# Patient Record
Sex: Male | Born: 1955
Health system: Southern US, Community
[De-identification: ages and names within clinical notes are randomized; demographics above are authoritative.]

## PROBLEM LIST (undated history)

## (undated) DIAGNOSIS — D649 Anemia, unspecified: Secondary | ICD-10-CM

## (undated) DIAGNOSIS — K219 Gastro-esophageal reflux disease without esophagitis: Secondary | ICD-10-CM

## (undated) DIAGNOSIS — I10 Essential (primary) hypertension: Secondary | ICD-10-CM

## (undated) DIAGNOSIS — C801 Malignant (primary) neoplasm, unspecified: Secondary | ICD-10-CM

## (undated) DIAGNOSIS — E785 Hyperlipidemia, unspecified: Secondary | ICD-10-CM

## (undated) DIAGNOSIS — F419 Anxiety disorder, unspecified: Secondary | ICD-10-CM

## (undated) HISTORY — PX: COLONOSCOPY: SHX174

## (undated) HISTORY — DX: Hyperlipidemia, unspecified: E78.5

## (undated) HISTORY — DX: Essential (primary) hypertension: I10

---

## 2009-06-10 HISTORY — PX: CARDIAC CATHETERIZATION: SHX172

## 2009-06-17 ENCOUNTER — Inpatient Hospital Stay (HOSPITAL_COMMUNITY): Admission: EM | Admit: 2009-06-17 | Discharge: 2009-06-19 | Payer: Self-pay | Admitting: Emergency Medicine

## 2009-06-17 ENCOUNTER — Ambulatory Visit: Payer: Self-pay | Admitting: Internal Medicine

## 2009-06-18 ENCOUNTER — Encounter: Payer: Self-pay | Admitting: Cardiovascular Disease

## 2009-07-09 DIAGNOSIS — R079 Chest pain, unspecified: Secondary | ICD-10-CM | POA: Insufficient documentation

## 2009-07-09 DIAGNOSIS — E785 Hyperlipidemia, unspecified: Secondary | ICD-10-CM | POA: Insufficient documentation

## 2009-07-09 DIAGNOSIS — F411 Generalized anxiety disorder: Secondary | ICD-10-CM | POA: Insufficient documentation

## 2009-07-11 ENCOUNTER — Ambulatory Visit: Payer: Self-pay | Admitting: Cardiovascular Disease

## 2010-03-03 ENCOUNTER — Ambulatory Visit: Payer: Self-pay | Admitting: Cardiovascular Disease

## 2010-05-12 NOTE — Assessment & Plan Note (Signed)
Summary: eph/ gd   CC:  follow up hospital.  History of Present Illness: Jason Bray is a patient of Dr Jason Bray and Jason Bray.  He was recently hospitalized for SSCP.  I am not sure how he ended up F/U with me.  I had to review his HP and cath.  He apparantly came in with marked anterolateral ST segment depression and R/O for MI.  His cath showed no fixed epicardial CAD.  I reviewed this and saw no myocardial bridging either.  There was a question of spasm but he was D/C on BB.  Since D/C he has had numerous minor episodes of what sound like musculoskeletal pain.  He is very anxious and fixates on his heart.  He does not describe exertional pain.  He has taken nitro on two occasions and is unsure if this has helped.  He has not had any pain lasting more than a minute or two.  I suggested that he see Dr Jason Bray about starting an anxiolytic or anti-depressant.  He denies palpitations, dyspnea, syncope or edema He has been compliant with his meds.    Review E-chart records Review Cath films  Current Problems (verified): 1)  Chest Pain  (ICD-786.50) 2)  Anxiety  (ICD-300.00) 3)  Hyperlipidemia  (ICD-272.4)  Current Medications (verified): 1)  Aspirin Ec 325 Mg Tbec (Aspirin) .... Take One Tablet By Mouth Daily 2)  Simvastatin 20 Mg Tabs (Simvastatin) .... Take One Tablet By Mouth Daily At Bedtime 3)  Metoprolol Tartrate 25 Mg Tabs (Metoprolol Tartrate) .... Take One Tablet By Mouth Twice A Day 4)  Nitrostat 0.4 Mg Subl (Nitroglycerin) .Marland Kitchen.. 1 Tablet Under Tongue At Onset of Chest Pain; You May Repeat Every 5 Minutes For Up To 3 Doses.  Allergies (verified): No Known Drug Allergies  Past History:  Family History: Last updated: 07/11/2009 non-contributory  Family History: non-contributory  Social History: Married Non-smoker [employer] Works for [name]  Review of Systems       Denies fever, malais, weight loss, blurry vision, decreased visual acuity, cough, sputum, SOB,  hemoptysis, pleuritic pain, palpitaitons, heartburn, abdominal pain, melena, lower extremity edema, claudication, or rash. All other systems reviewed and negative  Vital Signs:  Patient profile:   55 year old male Height:      67 inches Weight:      166 pounds BMI:     26.09 Pulse rate:   80 / minute Resp:     12 per minute BP sitting:   130 / 80  (left arm)  Vitals Entered By: Jason Bray (July 11, 2009 8:20 AM)  Physical Exam  General:  Affect appropriate Healthy:  appears stated age HEENT: normal Neck supple with no adenopathy JVP normal no bruits no thyromegaly Lungs clear with no wheezing and good diaphragmatic motion Heart:  S1/S2 no murmur,rub, gallop or click PMI normal Abdomen: benighn, BS positve, no tenderness, no AAA no bruit.  No HSM or HJR Distal pulses intact with no bruits No edema Neuro non-focal Skin warm and dry    Impression & Recommendations:  Problem # 1:  CHEST PAIN (ICD-786.50) No fixed epicardial CAD.  ? spasm though unlikely as he presented with ST depression not elevation.  Continue ASA and BB especially since anxiety brings this on.  Consider F/U cardiac CT in 6 months.  as needed nitro His updated medication list for this problem includes:    Aspirin Ec 325 Mg Tbec (Aspirin) .Marland Kitchen... Take one tablet by mouth daily  Metoprolol Tartrate 25 Mg Tabs (Metoprolol tartrate) .Marland Kitchen... Take one tablet by mouth twice a day    Nitrostat 0.4 Mg Subl (Nitroglycerin) .Marland Kitchen... 1 tablet under tongue at onset of chest pain; you may repeat every 5 minutes for up to 3 doses.  Problem # 2:  ANXIETY (ICD-300.00) F/U Dr Jason Bray would benefit form counseling or med like Cymbalta  Problem # 3:  HYPERLIPIDEMIA (ICD-272.4) F/U lipid porifile in 6 months His updated medication list for this problem includes:    Simvastatin 20 Mg Tabs (Simvastatin) .Marland Kitchen... Take one tablet by mouth daily at bedtime  Patient Instructions: 1)  Your physician recommends that you schedule a  follow-up appointment in: 6 MONTH

## 2010-05-12 NOTE — Assessment & Plan Note (Signed)
Summary: rov   CC:  check up...pt states he on a med for gerd but does not know the name of it as well as his sleeping pill.  History of Present Illness: Jason Bray is a patient of Dr Ladona Ridgel and Riley Kill.  He was  hospitalized in March  for SSCP.  I am not sure how he ended up F/U with me.  I had to review his HP and cath.  He apparantly came in with marked anterolateral ST segment depression and R/O for MI.  His cath showed no fixed epicardial CAD.  I reviewed this and saw no myocardial bridging either.  There was a question of spasm  He had some anxiety and depression over job issues and this seems to have improved. He has not had furthr SSCP.  He saw Dr Foy Guadalajara recently and his cholesterol is markedly imporved on statin.    Current Problems (verified): 1)  Chest Pain  (ICD-786.50) 2)  Anxiety  (ICD-300.00) 3)  Hyperlipidemia  (ICD-272.4)  Current Medications (verified): 1)  Aspirin Ec 325 Mg Tbec (Aspirin) .... Take One Tablet By Mouth Daily 2)  Simvastatin 20 Mg Tabs (Simvastatin) .... Take One Tablet By Mouth Daily At Bedtime 3)  Metoprolol Tartrate 25 Mg Tabs (Metoprolol Tartrate) .... Take One Tablet By Mouth Twice A Day 4)  Nitrostat 0.4 Mg Subl (Nitroglycerin) .Marland Kitchen.. 1 Tablet Under Tongue At Onset of Chest Pain; You May Repeat Every 5 Minutes For Up To 3 Doses. 5)  Sleeping Pill .... As Needed  Allergies (verified): No Known Drug Allergies  Past History:  Past Medical History: Last updated: 07/09/2009 Current Problems:  CHEST PAIN (ICD-786.50) ANXIETY (ICD-300.00) HYPERLIPIDEMIA (ICD-272.4)  Family History: Last updated: 07/11/2009 non-contributory  Social History: Last updated: 07/11/2009 Married Non-smoker [employer] Works for [name]  Review of Systems       Denies fever, malais, weight loss, blurry vision, decreased visual acuity, cough, sputum, SOB, hemoptysis, pleuritic pain, palpitaitons, heartburn, abdominal pain, melena, lower extremity edema,  claudication, or rash.   Vital Signs:  Patient profile:   55 year old male Height:      67 inches Weight:      166 pounds BMI:     26.09 Pulse rate:   84 / minute Resp:     14 per minute BP sitting:   127 / 78  (left arm)  Vitals Entered By: Kem Parkinson (March 03, 2010 4:25 PM)  Physical Exam  General:  Affect appropriate Healthy:  appears stated age HEENT: normal Neck supple with no adenopathy JVP normal no bruits no thyromegaly Lungs clear with no wheezing and good diaphragmatic motion Heart:  S1/S2 no murmur,rub, gallop or click PMI normal Abdomen: benighn, BS positve, no tenderness, no AAA no bruit.  No HSM or HJR Distal pulses intact with no bruits No edema Neuro non-focal Skin warm and dry    Impression & Recommendations:  Problem # 1:  CHEST PAIN (ICD-786.50) Normal cath  Likely more related to reflux or muscular.   His updated medication list for this problem includes:    Aspirin Ec 325 Mg Tbec (Aspirin) .Marland Kitchen... Take one tablet by mouth daily    Metoprolol Tartrate 25 Mg Tabs (Metoprolol tartrate) .Marland Kitchen... Take one tablet by mouth twice a day    Nitrostat 0.4 Mg Subl (Nitroglycerin) .Marland Kitchen... 1 tablet under tongue at onset of chest pain; you may repeat every 5 minutes for up to 3 doses.  Problem # 2:  ANXIETY (ICD-300.00) Improved and appears  situational related to work.  F/U Dr Foy Guadalajara and consider Cymbalta as needed  Problem # 3:  HYPERLIPIDEMIA (ICD-272.4) Continue statin  Labs per primary His updated medication list for this problem includes:    Simvastatin 20 Mg Tabs (Simvastatin) .Marland Kitchen... Take one tablet by mouth daily at bedtime   EKG Report  Procedure date:  06/18/2009  Findings:      NSR 76 RAD [facility] ST/T wave changes

## 2010-07-06 LAB — BASIC METABOLIC PANEL
BUN: 9 mg/dL (ref 6–23)
Creatinine, Ser: 0.89 mg/dL (ref 0.4–1.5)
GFR calc Af Amer: >60 mL/min (ref >60)

## 2010-07-06 LAB — DIFFERENTIAL
Basophils Relative: 1 % (ref 0–1)
Eosinophils Absolute: 0 10*3/uL (ref 0.0–0.7)
Eosinophils Relative: 0 % (ref 0–5)
Lymphocytes Relative: 19 % (ref 12–46)
Lymphs Abs: 1.9 10*3/uL (ref 0.7–4.0)
Monocytes Absolute: 0.6 10*3/uL (ref 0.1–1.0)
Monocytes Relative: 6 % (ref 3–12)
Neutro Abs: 7.5 10*3/uL (ref 1.7–7.7)

## 2010-07-06 LAB — CK TOTAL AND CKMB (NOT AT ARMC)
CK, MB: 1.3 ng/mL (ref 0.3–4.0)
Relative Index: 0.9 (ref 0.0–2.5)

## 2010-07-06 LAB — CBC
HCT: 43 % (ref 39.0–52.0)
Hemoglobin: 14.5 g/dL (ref 13.0–17.0)
MCHC: 33.7 g/dL (ref 30.0–36.0)
MCHC: 33.9 g/dL (ref 30.0–36.0)
MCV: 89 fL (ref 78.0–100.0)
Platelets: 417 10*3/uL — ABNORMAL HIGH (ref 150–400)
RBC: 4.83 MIL/uL (ref 4.22–5.81)
RDW: 13 % (ref 11.5–15.5)
WBC: 10.1 10*3/uL (ref 4.0–10.5)

## 2010-07-06 LAB — COMPREHENSIVE METABOLIC PANEL
ALT: 23 U/L (ref 0–53)
AST: 24 U/L (ref 0–37)
Alkaline Phosphatase: 63 U/L (ref 39–117)
BUN: 7 mg/dL (ref 6–23)
CO2: 27 mEq/L (ref 19–32)
GFR calc Af Amer: >60 mL/min (ref >60)
Total Bilirubin: 1.1 mg/dL (ref 0.3–1.2)
Total Protein: 7.7 g/dL (ref 6.0–8.3)

## 2010-07-06 LAB — CARDIAC PANEL(CRET KIN+CKTOT+MB+TROPI)
CK, MB: 1 ng/mL (ref 0.3–4.0)
Relative Index: 0.7 (ref 0.0–2.5)
Total CK: 136 U/L (ref 7–232)
Total CK: 149 U/L (ref 7–232)
Troponin I: 0.06 ng/mL (ref 0.00–0.06)

## 2010-07-06 LAB — POCT CARDIAC MARKERS: Myoglobin, poc: 46.7 ng/mL (ref 12–200)

## 2010-07-06 LAB — HEPARIN LEVEL (UNFRACTIONATED): Heparin Unfractionated: 0.34 IU/mL (ref 0.30–0.70)

## 2010-07-06 LAB — PROTIME-INR: Prothrombin Time: 12.9 seconds (ref 11.6–15.2)

## 2010-07-06 LAB — D-DIMER, QUANTITATIVE: D-Dimer, Quant: <0.22 ug/mL-FEU (ref 0.00–0.48)

## 2010-07-06 LAB — TROPONIN I: Troponin I: 0.01 ng/mL (ref 0.00–0.06)

## 2011-02-10 ENCOUNTER — Other Ambulatory Visit: Payer: Self-pay | Admitting: *Deleted

## 2011-02-10 MED ORDER — SIMVASTATIN 20 MG PO TABS: 20.0000 mg | ORAL_TABLET | Freq: Every day | ORAL | Status: DC

## 2011-02-10 MED ORDER — METOPROLOL TARTRATE 25 MG PO TABS: 25.0000 mg | ORAL_TABLET | Freq: Two times a day (BID) | ORAL | Status: DC

## 2012-08-09 ENCOUNTER — Encounter (INDEPENDENT_AMBULATORY_CARE_PROVIDER_SITE_OTHER): Payer: Self-pay | Admitting: Surgery

## 2012-08-09 ENCOUNTER — Ambulatory Visit (INDEPENDENT_AMBULATORY_CARE_PROVIDER_SITE_OTHER): Payer: BC Managed Care – PPO | Admitting: Surgery

## 2012-08-09 VITALS — BP 138/82 | HR 86 | Temp 98.4°F | Resp 18 | Ht 67.0 in | Wt 174.4 lb

## 2012-08-09 DIAGNOSIS — C184 Malignant neoplasm of transverse colon: Secondary | ICD-10-CM

## 2012-08-09 DIAGNOSIS — D126 Benign neoplasm of colon, unspecified: Secondary | ICD-10-CM | POA: Insufficient documentation

## 2012-08-09 MED ORDER — NEOMYCIN SULFATE 500 MG PO TABS: 1000.0000 mg | ORAL_TABLET | ORAL | Status: DC

## 2012-08-09 MED ORDER — METRONIDAZOLE 500 MG PO TABS: 500.0000 mg | ORAL_TABLET | ORAL | Status: DC

## 2012-08-09 NOTE — Progress Notes (Signed)
Subjective:     Patient ID: Jason Bray, male   DOB: 29-Apr-1955, 57 y.o.   MRN: [id number]  HPI  Jason Bray  28-Jun-1955 [id number]  Patient Care Team: Lenora Boys, MD as PCP - General (Family Medicine) Shirley Friar, MD as Consulting Physician (Gastroenterology) Ardeth Sportsman, MD as Consulting Physician (General Surgery)  This patient is a 57 y.o.male who presents today for surgical evaluation at the request of Dr. Bosie Clos.   Reason for visit: Newly diagnosed mid colon masses.  One strongly suspicious for cancer.  Pleasant active male.  No family history of bowel issues aside from probable colon arteriovenous malformations controlled with cautery on his father.  Had screening colonoscopy.  Found to have large colon polyp in upper descending colon.  Found to have larger circumferential mass in proximal transverse colon very suspicious for cancer.  Biopsies pending.  Surgical consultation requested.  Patient had an episode of chest pain and discomfort.  Negative cardiac workup by Dr. Winn Jock 2011.  Walks on a treadmill 2-1/2 miles a day without difficulty.  No exertional chest/neck/shoulder/arm pain. Daily bowel movements.  No bleeding.  He comes today with his wife.  No personal nor family history of GI/colon cancer, inflammatory bowel disease, irritable bowel syndrome, allergy such as Celiac Sprue, dietary/dairy problems, colitis, ulcers nor gastritis.  No recent sick contacts/gastroenteritis.  No travel outside the country.  No changes in diet.  No abdominal surgeries.    Patient Active Problem List   Diagnosis Date Noted  . 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 Adenomatous colon polyp - prox descending colon 08/09/2012  . HYPERLIPIDEMIA 07/09/2009  . ANXIETY 07/09/2009  . CHEST PAIN 07/09/2009    Past Medical History  Diagnosis Date  . Hyperlipidemia     Past Surgical History  Procedure Laterality Date  . Cardiac catheterization  06/2009    non  cardiac    History   Social History  . Marital Status: Married    Spouse Name: N/A    Number of Children: N/A  . Years of Education: N/A   Occupational History  . Not on file.   Social History Main Topics  . Smoking status: Former Smoker    Types: Cigarettes    Quit date: 01/19/2007  . Smokeless tobacco: Former Neurosurgeon    Quit date: 08/10/2006  . Alcohol Use: 2.4 oz/week    4 Glasses of wine per week     Comment: Weekly.  . Drug Use: No  . Sexually Active: Not on file   Other Topics Concern  . Not on file   Social History Narrative  . No narrative on file    Family History  Problem Relation Age of Onset  . Lymphoma Mother   . Hypertension Father   . Hyperlipidemia Father   . Hypertension Brother     Current Outpatient Prescriptions  Medication Sig Dispense Refill  . aspirin 325 MG tablet Take 325 mg by mouth daily.      . fish oil-omega-3 fatty acids 1000 MG capsule Take 2 g by mouth 3 (three) times daily.      . metoprolol tartrate (LOPRESSOR) 25 MG tablet Take 1 tablet (25 mg total) by mouth 2 (two) times daily.  60 tablet  12  . Misc Natural Products (OSTEO BI-FLEX ADV JOINT SHIELD) TABS Take by mouth.      . simvastatin (ZOCOR) 20 MG tablet Take 1 tablet (20 mg total) by mouth at bedtime.  30 tablet  12  . metroNIDAZOLE (FLAGYL) 500 MG tablet Take 1 tablet (500 mg total) by mouth as directed. Take 2 pills (=1000mg ) by mouth at 1pm, 3pm, and 10pm the day before your colorectal operation as discussed in [facility] office.  Call [phone] with questions  6 tablet  0  . neomycin (MYCIFRADIN) 500 MG tablet Take 2 tablets (1,000 mg total) by mouth as directed. Take 2 pills (=1000mg ) by mouth at 1pm, 3pm, and 10pm the day before your colorectal operation as discussed in [facility] office.  Call [phone] with questions  6 tablet  0  . ranitidine (ZANTAC) 300 MG capsule Take 300 mg by mouth every evening.       No current facility-administered medications for this visit.      No Known Allergies  BP 138/82  Pulse 86  Temp(Src) 98.4 F (36.9 C) (Temporal)  Resp 18  Ht 5\' 7"  (1.702 m)  Wt 174 lb 6.4 oz (79.107 kg)  BMI 27.31 kg/m2  No results found.   Review of Systems  Constitutional: Negative for fever, chills and diaphoresis.  HENT: Negative for nosebleeds, sore throat, facial swelling, mouth sores, trouble swallowing and ear discharge.   Eyes: Negative for photophobia, discharge and visual disturbance.  Respiratory: Negative for choking, chest tightness, shortness of breath and stridor.   Cardiovascular: Negative for chest pain and palpitations.  Gastrointestinal: Negative for nausea, vomiting, abdominal pain, diarrhea, constipation, blood in stool, abdominal distention, anal bleeding and rectal pain.  Genitourinary: Negative for dysuria, urgency, difficulty urinating and testicular pain.  Musculoskeletal: Negative for myalgias, back pain, arthralgias and gait problem.  Skin: Negative for color change, pallor, rash and wound.  Neurological: Negative for dizziness, speech difficulty, weakness, numbness and headaches.  Hematological: Negative for adenopathy. Does not bruise/bleed easily.  Psychiatric/Behavioral: Negative for hallucinations, confusion and agitation.       Objective:   Physical Exam  Constitutional: He is oriented to person, place, and time. He appears well-developed and well-nourished. No distress.  HENT:  Head: Normocephalic.  Mouth/Throat: Oropharynx is clear and moist. No oropharyngeal exudate.  Eyes: Conjunctivae, EOM and lids are normal. Pupils are equal, round, and reactive to light. No scleral icterus.  Neck: Normal range of motion. Neck supple. No tracheal deviation present.  Cardiovascular: Normal rate, regular rhythm and intact distal pulses.   Pulmonary/Chest: Effort normal and breath sounds normal. No respiratory distress.  Abdominal: Soft. He exhibits no distension. There is no tenderness. Hernia confirmed negative  in the right inguinal area and confirmed negative in the left inguinal area.  Musculoskeletal: Normal range of motion. He exhibits no tenderness.  Lymphadenopathy:    He has no cervical adenopathy.       Right: No inguinal adenopathy present.       Left: No inguinal adenopathy present.  Neurological: He is alert and oriented to person, place, and time. No cranial nerve deficit. He exhibits normal muscle tone. Coordination normal.  Skin: Skin is warm and dry. No rash noted. He is not diaphoretic. No erythema. No pallor.  Psychiatric: He has a normal mood and affect. His behavior is normal. Judgment and thought content normal.       Assessment:     Two masses in the mid colon.  Transverse colon strongly suspicious for cancer.  Proximal descending colon consistent with large adenomatous polyp.  Pathology pending.  Need for resection.     Plan:     I recommended the laparoscopic possible open resection.  He may need a  moderate amount of his colon removed.  We will get pathology results.  If cancer confirmed, order CEA & preoperative CT of chest/abdomen/pelvis with oral and IV contrast per protocol.  I discussed the procedure at length with the patient and his wife:  The anatomy & physiology of the digestive tract was discussed.  The pathophysiology was discussed.  Natural history risks without surgery was discussed.   I feel the risks of no intervention will lead to serious problems that outweigh the operative risks; therefore, I recommended a partial colectomy to remove the pathology.  Laparoscopic & open techniques were discussed.   Risks such as bleeding, infection, abscess, leak, reoperation, possible ostomy, hernia, heart attack, death, and other risks were discussed.  I noted a good likelihood this will help address the problem.   Goals of post-operative recovery were discussed as well.  We will work to minimize complications.  An educational handout on the pathology was given as well.   Questions were answered.  The patient & wife expressed understanding & wishes to proceed with surgery ASAP.

## 2012-08-09 NOTE — Patient Instructions (Addendum)
See the Handout(s) we gave you.  Consider surgery.  Please call our office at [phone] if you wish to schedule surgery or if you have further questions / concerns.    Colorectal Cancer Colorectal cancer is an abnormal growth of tissue (tumor) in the colon or rectum that is cancerous (malignant). Unlike noncancerous (benign) tumors, malignant tumors can spread to other parts of your body. The colon is the large bowel or large intestine. The rectum is the last several inches of the colon. CAUSES  The exact cause of colon cancer is unknown.  RISK FACTORS The majority of patients do not have identifiable risk factors, but the following factors may increase your chances of getting colon cancer:  Age. Most colorectal cancers occur in people older than 50 years.  Having abnormal growths (polyps) on the inner wall of the colon or rectum.  Diabetes.  Being African American.  Family history of hereditary nonpolyposis colon cancer. This condition is caused by changes in the genes that are responsible for repairing mismatched DNA.  Family history of familial adenomatous polyposis (FAP). This is a rare, inherited condition in which hundreds of polyps form in the colon and rectum. It is caused by a change in the APC gene. Unless FAP is treated, it usually leads to colorectal cancer by age 68.  Personal history of cancer. A person who has already had colorectal cancer may develop it a second time. Also, women with a history of ovarian, uterine, or breast cancer are at a somewhat higher risk of developing colorectal cancer.  Inflammatory bowel disease, including ulcerative colitis and Crohn's disease.  Being obese or eating a diet that is high in fat (especially animal fat) and low in fiber, fruits, and vegetables.  Smoking. A person who smokes cigarettes may be at increased risk of developing polyps and colorectal cancer.  Heavy alcohol use. SYMPTOMS Early colorectal cancer often does not  cause symptoms. As the cancer grows, symptoms may include:  Diarrhea.  Constipation.  Feeling like the bowel does not empty completely after a bowel movement.  Blood in the stool.  Stools that are narrower than usual.  Abdominal discomfort, pain, bloating, fullness, or cramps.  Unexplained weight loss.  Constant tiredness.  Nausea and vomiting. DIAGNOSIS  Your caregiver will ask about your medical history. He or she may also perform a number of procedures, such as:  A physical exam.  Blood tests. This may include a routine complete blood count and iron level testing. Your caregiver may also check for carcinoembryonic antigen (CEA) and other substances in the blood. Some people who have colorectal cancer have a high CEA level. These levels may be used to follow the activity of your colon cancer.  Chest X-rays, computed tomography (CT) scans, or magnetic resonance imaging (MRI).  Taking a tissue sample (biopsy) from the colon or rectum. The sample is examined under a microscope to look for cancer cells.  Sigmoidoscopy. With this test, your caregiver can see inside your colon. A thin flexible tube (sigmoidoscope) is placed into your rectum. This device has a light source and a tiny video camera in it. Your caregiver uses the sigmoidoscope to look at the last third of your colon.  Colonoscopy. This test is like sigmoidoscopy, but your caregiver looks at the entire colon. This test usually requires medicine that helps you relax (sedative).  Endorectal ultrasound. With this test, your caregiver can see how deep a rectal tumor has grown and whether the cancer has spread to lymph nodes  or other nearby tissues. A tool (probe) is inserted into the rectum. The probe sends out sound waves to the rectum and nearby tissues, and a computer uses the echoes to create a picture. Your cancer will be staged to determine its severity and extent. Staging is a careful attempt to find out the size of the  tumor, whether the cancer has spread, and if so, to what parts of the body. You may need to have more tests to determine the stage of your cancer. The test results will help determine what treatment plan is best for you. STAGES  Stage 0. The cancer is found only in the innermost lining of the colon or rectum.  Stage I. The cancer has grown into the inner wall of the colon or rectum. The cancer has not yet reached the outer wall of the colon.  Stage II. The cancer extends more deeply into or through the wall of the colon or rectum. It may have invaded nearby tissue, but cancer cells have not spread to the lymph nodes.  Stage III. The cancer has spread to nearby lymph nodes but not to other parts of the body.  Stage IV. The cancer has spread to other parts of the body, such as the liver or lungs. Your caregiver may tell you the detailed stage of your cancer. In that case, the stage will include both a number and a letter. TREATMENT  Depending on the type and stage, colorectal cancer may be treated with surgery, radiation therapy, or chemotherapy. Some patients have a combination of these therapies.  Surgery may be done to remove the polyps from your colon. In early stages, your caregiver may be able to do this during a colonoscopy. In later stages, surgery may be done to remove part of your colon.  Radiation therapy uses high-energy rays to kill cancer cells. This is usually recommended for patients with rectal cancer.  Chemotherapy is the use of drugs to kill cancer cells. Caregivers also give chemotherapy to help reduce pain and other problems caused by colorectal cancer. This may be done even if the cancer is not curable. HOME CARE INSTRUCTIONS   Only take over-the-counter or prescription medicines for pain, discomfort, or fever as directed by your caregiver.  Maintain a healthy diet.  Consider joining a support group. This may help you learn to cope with the stress of having colorectal  cancer.  Seek advice to help you manage treatment side effects.  Keep all follow-up appointments as directed by your caregiver.  Inform your cancer specialist if you are admitted to the hospital. SEEK IMMEDIATE MEDICAL CARE IF:   Your diarrhea or constipation does not go away.  You have alternating constipation and diarrhea.  You have blood in your stools.  Your abdominal pain gets worse.  You lose weight without trying.  You notice new fatigue or weakness.  You develop a fever during chemotherapy treatment. Document Released: 03/29/2005 Document Revised: 06/21/2011 Document Reviewed: 03/16/2011 [facility] Patient Information 2013 [place], [place].  ABDOMINAL SURGERY: POST OP INSTRUCTIONS  1. DIET: Follow a light bland diet the first 24 hours after arrival home, such as soup, liquids, crackers, etc.  Be sure to include lots of fluids daily.  Avoid fast food or heavy meals as your are more likely to get nauseated.  Eat a low fat the next few days after surgery.   2. Take your usually prescribed home medications unless otherwise directed. 3. PAIN CONTROL: a. Pain is best controlled by a usual combination  of three different methods TOGETHER: i. Ice/Heat ii. Over the counter pain medication iii. Prescription pain medication b. Most patients will experience some swelling and bruising around the incisions.  Ice packs or heating pads (30-60 minutes up to 6 times a day) will help. Use ice for the first few days to help decrease swelling and bruising, then switch to heat to help relax tight/sore spots and speed recovery.  Some people prefer to use ice alone, heat alone, alternating between ice & heat.  Experiment to what works for you.  Swelling and bruising can take several weeks to resolve.   c. It is helpful to take an over-the-counter pain medication regularly for the first few weeks.  Choose one of the following that works best for you: i. Naproxen (Aleve, etc)  Two 220mg  tabs twice a  day ii. Ibuprofen (Advil, etc) Three 200mg  tabs four times a day (every meal & bedtime) iii. Acetaminophen (Tylenol, etc) 500-650mg  four times a day (every meal & bedtime) d. A  prescription for pain medication (such as oxycodone, hydrocodone, etc) should be given to you upon discharge.  Take your pain medication as prescribed.  i. If you are having problems/concerns with the prescription medicine (does not control pain, nausea, vomiting, rash, itching, etc), please call us [phone] to see if we need to switch you to a different pain medicine that will work better for you and/or control your side effect better. ii. If you need a refill on your pain medication, please contact your pharmacy.  They will contact our office to request authorization. Prescriptions will not be filled after 5 pm or on week-ends. 4. Avoid getting constipated.  Between the surgery and the pain medications, it is common to experience some constipation.  Increasing fluid intake and taking a fiber supplement (such as Metamucil, Citrucel, FiberCon, MiraLax, etc) 1-2 times a day regularly will usually help prevent this problem from occurring.  A mild laxative (prune juice, Milk of Magnesia, MiraLax, etc) should be taken according to package directions if there are no bowel movements after 48 hours.   5. Watch out for diarrhea.  If you have many loose bowel movements, simplify your diet to bland foods & liquids for a few days.  Stop any stool softeners and decrease your fiber supplement.  Switching to mild anti-diarrheal medications (Kayopectate, Pepto Bismol) can help.  If this worsens or does not improve, please call us. 6. Wash / shower every day.  You may shower over the incision / wound.  Avoid baths until the skin is fully healed.  Continue to shower over incision(s) after the dressing is off. 7. Remove your waterproof bandages 5 days after surgery.  You may leave the incision open to air.  You may replace a dressing/Band-Aid  to cover the incision for comfort if you wish. 8. ACTIVITIES as tolerated:   a. You may resume regular (light) daily activities beginning the next day-such as daily self-care, walking, climbing stairs-gradually increasing activities as tolerated.  If you can walk 30 minutes without difficulty, it is safe to try more intense activity such as jogging, treadmill, bicycling, low-impact aerobics, swimming, etc. b. Save the most intensive and strenuous activity for last such as sit-ups, heavy lifting, contact sports, etc  Refrain from any heavy lifting or straining until you are off narcotics for pain control.   c. DO NOT PUSH THROUGH PAIN.  Let pain be your guide: If it hurts to do something, don't do it.  Pain is your body  warning you to avoid that activity for another week until the pain goes down. d. You may drive when you are no longer taking prescription pain medication, you can comfortably wear a seatbelt, and you can safely maneuver your car and apply brakes. e. Bonita Quin may have sexual intercourse when it is comfortable.  9. FOLLOW UP in our office a. Please call [facility] at [phone] to set up an appointment to see your surgeon in the office for a follow-up appointment approximately 1-2 weeks after your surgery. b. Make sure that you call for this appointment the day you arrive home to insure a convenient appointment time. 10. IF YOU HAVE DISABILITY OR FAMILY LEAVE FORMS, BRING THEM TO THE OFFICE FOR PROCESSING.  DO NOT GIVE THEM TO YOUR DOCTOR.   WHEN TO CALL us [phone]: 1. Poor pain control 2. Reactions / problems with new medications (rash/itching, nausea, etc)  3. Fever over 101.5 F (38.5 C) 4. Inability to urinate 5. Nausea and/or vomiting 6. Worsening swelling or bruising 7. Continued bleeding from incision. 8. Increased pain, redness, or drainage from the incision  The clinic staff is available to answer your questions during regular business hours (8:30am-5pm).  Please don't  hesitate to call and ask to speak to one of our nurses for clinical concerns.   A surgeon from [facility] is always on call at the hospitals   If you have a medical emergency, go to the nearest emergency room or call 911.    [facility] [address], [place], [place]  [address] ? MAIN: [phone] ? TOLL FREE: [phone] ? FAX [phone] [email or web address]   COLON PREP INSTRUCTIONS for upper/proximal colectomy:   Obtain what you need at a pharmacy of your choice:      Prescriptions for your oral antibiotics (Neomycin & Metronidazole)     A bottle of Milk of Magnesia   DAY PRIOR TO SURGERY:    1:00pm    o Take 2 oz (4 tablespoons) Milk of Magnesia. o Drink plenty of liquids o Take 2 Neomycin 500mg  tablets & 2 Metronidazole 500mg  tablets     3:00pm:    o Take 2 Neomycin 500mg  tablets & 2 Metronidazole 500mg  tablets  o Drink plenty of liquids    Bedtime (~10:00pm)  o Take 2 Neomycin 500mg  tablets & 2 Metronidazole 500mg  tablets o Drink plenty of liquids    Midnight:  Do not eat or drink anything after midnight the night before your surgery.   MORNING OF PROCEDURE:    Remember to not to drink or eat anything that morning      If you have questions or problems, please call [facility] [phone] speak to someone in the clinic department at our office

## 2012-08-11 ENCOUNTER — Telehealth (INDEPENDENT_AMBULATORY_CARE_PROVIDER_SITE_OTHER): Payer: Self-pay

## 2012-08-11 ENCOUNTER — Encounter (HOSPITAL_COMMUNITY): Payer: Self-pay | Admitting: Pharmacy Technician

## 2012-08-11 DIAGNOSIS — C189 Malignant neoplasm of colon, unspecified: Secondary | ICD-10-CM

## 2012-08-11 NOTE — Telephone Encounter (Signed)
Called pt's wife to notify her that we did receive the path report on the polyps. The larger polyp is positive for colon cancer and the smaller polyp is not a cancer but a tubulovillous adenoma per Dr Michaell Cowing. I advised her that this does not change the surgery plan that Dr Michaell Cowing discussed with them this week. I did advise her that since one of the polyp is positive for colon cancer we need to get a CEA and CT chest,abdomen,pelvis with contrast b/c this is standard care with a new diagnosis of colon cancer. I have our scheduling cordinator working on the date and time of the CT. We will call her back with the CT appt for next week.

## 2012-08-15 ENCOUNTER — Ambulatory Visit
Admission: RE | Admit: 2012-08-15 | Discharge: 2012-08-15 | Disposition: A | Discharge status: Home / Self Care | Payer: BC Managed Care – PPO | Source: Ambulatory Visit | Attending: Surgery | Admitting: Surgery

## 2012-08-15 DIAGNOSIS — C189 Malignant neoplasm of colon, unspecified: Secondary | ICD-10-CM

## 2012-08-15 LAB — BASIC METABOLIC PANEL
CO2: 27 mEq/L (ref 19–32)
Calcium: 9.8 mg/dL (ref 8.4–10.5)
Glucose, Bld: 81 mg/dL (ref 70–99)
Potassium: 4.2 mEq/L (ref 3.5–5.3)
Sodium: 138 mEq/L (ref 135–145)

## 2012-08-15 MED ORDER — IOHEXOL 300 MG/ML  SOLN
100.0000 mL | Freq: Once | INTRAMUSCULAR | Status: AC | PRN
  Administered 2012-08-15: 100 mL via INTRAVENOUS

## 2012-08-16 ENCOUNTER — Encounter (INDEPENDENT_AMBULATORY_CARE_PROVIDER_SITE_OTHER): Payer: Self-pay

## 2012-08-16 ENCOUNTER — Telehealth (INDEPENDENT_AMBULATORY_CARE_PROVIDER_SITE_OTHER): Payer: Self-pay

## 2012-08-16 NOTE — Telephone Encounter (Signed)
Called pt to give him his results to the CT scan and lab work. Pt advised of the CT scans showing no obvious metastatic disease but there is one lymph node that is enlarged that was near the polyp that was removed. I advised pt that we will not know anymore about the lymph node until after the surgery when the node is removed and sent to pathology. The pt advised about his labs being normal per Dr Michaell Cowing. The CEA lab was normal which Dr Michaell Cowing said this is a very good sign that we caught the cancer early. The pt requested that I leave this same message on his house answering machine so his wife could hear when they get home. I did call the house and give the same info. On the machine per pt's request. I advised if pt has any questions to please call the office.

## 2012-08-16 NOTE — Telephone Encounter (Signed)
Wife asking should Patient ask for extra time off from work. He applied for short term disability . Should he ask for FMLA ? Advised when patient comes in for Post op with Dr. Michaell Cowing it could be determined at that time . Patient is scheduled for surgery 08/25/12

## 2012-08-18 ENCOUNTER — Other Ambulatory Visit (HOSPITAL_COMMUNITY): Payer: Self-pay | Admitting: *Deleted

## 2012-08-21 ENCOUNTER — Encounter (HOSPITAL_COMMUNITY)
Admission: RE | Admit: 2012-08-21 | Discharge: 2012-08-21 | Disposition: A | Discharge status: Home / Self Care | Payer: BC Managed Care – PPO | Source: Ambulatory Visit | Attending: Surgery | Admitting: Surgery

## 2012-08-21 ENCOUNTER — Encounter (INDEPENDENT_AMBULATORY_CARE_PROVIDER_SITE_OTHER): Payer: Self-pay

## 2012-08-21 ENCOUNTER — Encounter (HOSPITAL_COMMUNITY): Payer: Self-pay

## 2012-08-21 DIAGNOSIS — I4510 Unspecified right bundle-branch block: Secondary | ICD-10-CM | POA: Insufficient documentation

## 2012-08-21 DIAGNOSIS — Z01812 Encounter for preprocedural laboratory examination: Secondary | ICD-10-CM | POA: Insufficient documentation

## 2012-08-21 DIAGNOSIS — Z01810 Encounter for preprocedural cardiovascular examination: Secondary | ICD-10-CM | POA: Insufficient documentation

## 2012-08-21 DIAGNOSIS — K6389 Other specified diseases of intestine: Secondary | ICD-10-CM | POA: Insufficient documentation

## 2012-08-21 DIAGNOSIS — Z0183 Encounter for blood typing: Secondary | ICD-10-CM | POA: Insufficient documentation

## 2012-08-21 HISTORY — DX: Gastro-esophageal reflux disease without esophagitis: K21.9

## 2012-08-21 HISTORY — DX: Malignant (primary) neoplasm, unspecified: C80.1

## 2012-08-21 HISTORY — DX: Anxiety disorder, unspecified: F41.9

## 2012-08-21 LAB — CBC
HCT: 34.8 % — ABNORMAL LOW (ref 39.0–52.0)
MCV: 67.3 fL — ABNORMAL LOW (ref 78.0–100.0)
Platelets: 727 10*3/uL — ABNORMAL HIGH (ref 150–400)
RBC: 5.17 MIL/uL (ref 4.22–5.81)
RDW: 16.9 % — ABNORMAL HIGH (ref 11.5–15.5)
WBC: 8.3 10*3/uL (ref 4.0–10.5)

## 2012-08-21 LAB — BASIC METABOLIC PANEL
CO2: 28 mEq/L (ref 19–32)
Chloride: 103 mEq/L (ref 96–112)
Creatinine, Ser: 0.74 mg/dL (ref 0.50–1.35)
GFR calc Af Amer: >90 mL/min (ref >90)
Sodium: 141 mEq/L (ref 135–145)

## 2012-08-21 LAB — ABO/RH: ABO/RH(D): O NEG

## 2012-08-21 LAB — SURGICAL PCR SCREEN: Staphylococcus aureus: POSITIVE — AB

## 2012-08-21 NOTE — Progress Notes (Signed)
CT chest 08/15/12 on [facility]

## 2012-08-21 NOTE — Patient Instructions (Addendum)
[address]ETT  08/21/2012   Your procedure is scheduled on: 08/25/12  Report to [facility] at 0745 AM.  Call this number if you have problems the morning of surgery 336-: [phone]   Remember:   Do not eat food or drink liquids After Midnight.   Do not wear jewelry, make-up or nail polish.  Do not wear lotions, powders, or perfumes. You may wear deodorant.  Do not shave 48 hours prior to surgery. Men may shave face and neck.  Do not bring valuables to the hospital.  Contacts, dentures or bridgework may not be worn into surgery.  Leave suitcase in the car. After surgery it may be brought to your room.  For patients admitted to the hospital, checkout time is 11:00 AM the day of discharge.    Please read over the following fact sheets that you were given: MRSA Information, blood fact sheet, incentive spirometry fact sheet Birdie Sons, RN  pre op nurse call if needed [phone]    FAILURE TO FOLLOW THESE INSTRUCTIONS MAY RESULT IN CANCELLATION OF YOUR SURGERY   Patient Signature: ___________________________________________

## 2012-08-22 ENCOUNTER — Telehealth (INDEPENDENT_AMBULATORY_CARE_PROVIDER_SITE_OTHER): Payer: Self-pay

## 2012-08-22 NOTE — Telephone Encounter (Signed)
Called Jason Bray the pharmacist to tell them I made a mistake on calling the Flagyl rx b/c I only ordered #3 tabs and it should of been #6. The pt needs to take the Flagyl 500mg  # take 2tabs at 1pm,3pm,and 10pm the day before surgery. The pharmacy will correct my mistake.

## 2012-08-22 NOTE — Telephone Encounter (Signed)
Called pt's bowel prep abx's to [facility] Neomycin 1g #3 take as directed at 1pm,3pm,and 10pm per Dr Michaell Cowing. I called Flagyl 500mg  #3 take as directed 1pm,3pm,and 10pm per Dr Michaell Cowing. I called pt's wife Waynetta Sandy to notify her that we called the rx to [facility].

## 2012-08-25 ENCOUNTER — Encounter (HOSPITAL_COMMUNITY): Payer: Self-pay | Admitting: *Deleted

## 2012-08-25 ENCOUNTER — Ambulatory Visit (HOSPITAL_COMMUNITY): Payer: BC Managed Care – PPO | Admitting: Certified Registered Nurse Anesthetist

## 2012-08-25 ENCOUNTER — Encounter (HOSPITAL_COMMUNITY): Payer: Self-pay | Admitting: Certified Registered Nurse Anesthetist

## 2012-08-25 ENCOUNTER — Inpatient Hospital Stay (HOSPITAL_COMMUNITY)
Admission: RE | Admit: 2012-08-25 | Discharge: 2012-08-29 | DRG: 148 | Disposition: A | Discharge status: Home / Self Care | Payer: BC Managed Care – PPO | Source: Ambulatory Visit | Attending: Surgery | Admitting: Surgery

## 2012-08-25 ENCOUNTER — Encounter (HOSPITAL_COMMUNITY)
Admission: RE | Disposition: A | Discharge status: Home / Self Care | Payer: Self-pay | Source: Ambulatory Visit | Attending: Surgery

## 2012-08-25 DIAGNOSIS — Z79899 Other long term (current) drug therapy: Secondary | ICD-10-CM

## 2012-08-25 DIAGNOSIS — D126 Benign neoplasm of colon, unspecified: Secondary | ICD-10-CM | POA: Diagnosis present

## 2012-08-25 DIAGNOSIS — C184 Malignant neoplasm of transverse colon: Principal | ICD-10-CM | POA: Diagnosis present

## 2012-08-25 DIAGNOSIS — E875 Hyperkalemia: Secondary | ICD-10-CM | POA: Diagnosis not present

## 2012-08-25 DIAGNOSIS — D62 Acute posthemorrhagic anemia: Secondary | ICD-10-CM | POA: Diagnosis not present

## 2012-08-25 DIAGNOSIS — F411 Generalized anxiety disorder: Secondary | ICD-10-CM | POA: Diagnosis present

## 2012-08-25 DIAGNOSIS — Z87891 Personal history of nicotine dependence: Secondary | ICD-10-CM

## 2012-08-25 DIAGNOSIS — E785 Hyperlipidemia, unspecified: Secondary | ICD-10-CM | POA: Diagnosis present

## 2012-08-25 DIAGNOSIS — C183 Malignant neoplasm of hepatic flexure: Secondary | ICD-10-CM | POA: Diagnosis present

## 2012-08-25 DIAGNOSIS — C189 Malignant neoplasm of colon, unspecified: Secondary | ICD-10-CM

## 2012-08-25 HISTORY — PX: LAPAROSCOPIC PARTIAL COLECTOMY: SHX5907

## 2012-08-25 SURGERY — LAPAROSCOPIC PARTIAL COLECTOMY
Anesthesia: General | Site: Abdomen | Wound class: Contaminated

## 2012-08-25 MED ORDER — ALVIMOPAN 12 MG PO CAPS
12.0000 mg | ORAL_CAPSULE | Freq: Two times a day (BID) | ORAL | Status: DC
  Administered 2012-08-26 (×2): 12 mg via ORAL
  Filled 2012-08-25 (×4): qty 1

## 2012-08-25 MED ORDER — NAPROXEN 500 MG PO TABS: 500.0000 mg | ORAL_TABLET | Freq: Two times a day (BID) | ORAL | Status: DC

## 2012-08-25 MED ORDER — SODIUM CHLORIDE 0.9 % IJ SOLN: 3.0000 mL | INTRAMUSCULAR | Status: DC | PRN

## 2012-08-25 MED ORDER — LACTATED RINGERS IV BOLUS (SEPSIS): 1000.0000 mL | Freq: Three times a day (TID) | INTRAVENOUS | Status: AC | PRN

## 2012-08-25 MED ORDER — HEPARIN SODIUM (PORCINE) 5000 UNIT/ML IJ SOLN
5000.0000 [IU] | Freq: Three times a day (TID) | INTRAMUSCULAR | Status: DC
  Filled 2012-08-25 (×4): qty 1

## 2012-08-25 MED ORDER — EPHEDRINE SULFATE 50 MG/ML IJ SOLN
INTRAMUSCULAR | Status: DC | PRN
  Administered 2012-08-25: 10 mg via INTRAVENOUS

## 2012-08-25 MED ORDER — MUPIROCIN 2 % EX OINT
TOPICAL_OINTMENT | CUTANEOUS | Status: AC
  Administered 2012-08-25: 09:00:00
  Filled 2012-08-25: qty 22

## 2012-08-25 MED ORDER — SACCHAROMYCES BOULARDII 250 MG PO CAPS
250.0000 mg | ORAL_CAPSULE | Freq: Two times a day (BID) | ORAL | Status: DC
  Administered 2012-08-25 – 2012-08-29 (×8): 250 mg via ORAL
  Filled 2012-08-25 (×9): qty 1

## 2012-08-25 MED ORDER — BUPIVACAINE-EPINEPHRINE PF 0.25-1:200000 % IJ SOLN
INTRAMUSCULAR | Status: AC
  Filled 2012-08-25: qty 30

## 2012-08-25 MED ORDER — LACTATED RINGERS IR SOLN
Status: DC | PRN
  Administered 2012-08-25: 3000 mL

## 2012-08-25 MED ORDER — LACTATED RINGERS IV SOLN
INTRAVENOUS | Status: DC
  Administered 2012-08-25: 1000 mL via INTRAVENOUS

## 2012-08-25 MED ORDER — ONDANSETRON HCL 4 MG/2ML IJ SOLN
INTRAMUSCULAR | Status: DC | PRN
  Administered 2012-08-25: 4 mg via INTRAVENOUS

## 2012-08-25 MED ORDER — HYDROMORPHONE HCL PF 1 MG/ML IJ SOLN
0.2500 mg | INTRAMUSCULAR | Status: DC | PRN
  Administered 2012-08-25 (×3): 0.5 mg via INTRAVENOUS

## 2012-08-25 MED ORDER — BUPIVACAINE-EPINEPHRINE PF 0.25-1:200000 % IJ SOLN
INTRAMUSCULAR | Status: DC | PRN
  Administered 2012-08-25: 80 mL

## 2012-08-25 MED ORDER — METOPROLOL TARTRATE 1 MG/ML IV SOLN
5.0000 mg | Freq: Four times a day (QID) | INTRAVENOUS | Status: DC | PRN
  Filled 2012-08-25: qty 5

## 2012-08-25 MED ORDER — KCL IN DEXTROSE-NACL 40-5-0.9 MEQ/L-%-% IV SOLN
INTRAVENOUS | Status: DC
  Administered 2012-08-25 – 2012-08-26 (×2): via INTRAVENOUS
  Filled 2012-08-25 (×3): qty 1000

## 2012-08-25 MED ORDER — NEOSTIGMINE METHYLSULFATE 1 MG/ML IJ SOLN
INTRAMUSCULAR | Status: DC | PRN
  Administered 2012-08-25: 5 mg via INTRAVENOUS

## 2012-08-25 MED ORDER — HEPARIN SODIUM (PORCINE) 5000 UNIT/ML IJ SOLN
5000.0000 [IU] | Freq: Once | INTRAMUSCULAR | Status: AC
  Administered 2012-08-25: 5000 [IU] via SUBCUTANEOUS
  Filled 2012-08-25: qty 1

## 2012-08-25 MED ORDER — ACETAMINOPHEN 500 MG PO TABS
1000.0000 mg | ORAL_TABLET | Freq: Three times a day (TID) | ORAL | Status: DC
  Administered 2012-08-25 – 2012-08-29 (×12): 1000 mg via ORAL
  Filled 2012-08-25 (×14): qty 2

## 2012-08-25 MED ORDER — OXYCODONE HCL 5 MG PO TABS: 5.0000 mg | ORAL_TABLET | ORAL | Status: DC | PRN

## 2012-08-25 MED ORDER — ALVIMOPAN 12 MG PO CAPS
12.0000 mg | ORAL_CAPSULE | Freq: Once | ORAL | Status: AC
  Administered 2012-08-25: 12 mg via ORAL
  Filled 2012-08-25: qty 1

## 2012-08-25 MED ORDER — ZOLPIDEM TARTRATE 5 MG PO TABS: 5.0000 mg | ORAL_TABLET | Freq: Every evening | ORAL | Status: DC | PRN

## 2012-08-25 MED ORDER — HYDROMORPHONE HCL PF 1 MG/ML IJ SOLN
INTRAMUSCULAR | Status: AC
  Filled 2012-08-25: qty 1

## 2012-08-25 MED ORDER — BUPIVACAINE 0.25 % ON-Q PUMP DUAL CATH 300 ML
INJECTION | Status: DC | PRN
  Administered 2012-08-25: 300 mL

## 2012-08-25 MED ORDER — SODIUM CHLORIDE 0.9 % IJ SOLN: 3.0000 mL | Freq: Two times a day (BID) | INTRAMUSCULAR | Status: DC

## 2012-08-25 MED ORDER — LABETALOL HCL 5 MG/ML IV SOLN
INTRAVENOUS | Status: DC | PRN
  Administered 2012-08-25: 5 mg via INTRAVENOUS

## 2012-08-25 MED ORDER — MAGIC MOUTHWASH
15.0000 mL | Freq: Four times a day (QID) | ORAL | Status: DC | PRN
  Filled 2012-08-25: qty 15

## 2012-08-25 MED ORDER — FENTANYL CITRATE 0.05 MG/ML IJ SOLN
INTRAMUSCULAR | Status: DC | PRN
  Administered 2012-08-25: 50 ug via INTRAVENOUS
  Administered 2012-08-25: 100 ug via INTRAVENOUS
  Administered 2012-08-25 (×4): 50 ug via INTRAVENOUS
  Administered 2012-08-25: 100 ug via INTRAVENOUS
  Administered 2012-08-25: 50 ug via INTRAVENOUS

## 2012-08-25 MED ORDER — BUPIVACAINE ON-Q PAIN PUMP (FOR ORDER SET NO CHG)
INJECTION | Status: DC
  Filled 2012-08-25: qty 1

## 2012-08-25 MED ORDER — ACETAMINOPHEN 10 MG/ML IV SOLN
INTRAVENOUS | Status: DC | PRN
  Administered 2012-08-25: 1000 mg via INTRAVENOUS

## 2012-08-25 MED ORDER — PROPOFOL 10 MG/ML IV BOLUS
INTRAVENOUS | Status: DC | PRN
  Administered 2012-08-25: 150 mg via INTRAVENOUS

## 2012-08-25 MED ORDER — METOPROLOL SUCCINATE ER 50 MG PO TB24
50.0000 mg | ORAL_TABLET | Freq: Every day | ORAL | Status: DC
  Administered 2012-08-25: 50 mg via ORAL
  Filled 2012-08-25: qty 1

## 2012-08-25 MED ORDER — SODIUM CHLORIDE 0.9 % IV SOLN
INTRAVENOUS | Status: DC
  Filled 2012-08-25: qty 6

## 2012-08-25 MED ORDER — PHENYLEPHRINE HCL 10 MG/ML IJ SOLN
INTRAMUSCULAR | Status: DC | PRN
  Administered 2012-08-25: 80 ug via INTRAVENOUS

## 2012-08-25 MED ORDER — BUPIVACAINE-EPINEPHRINE 0.25% -1:200000 IJ SOLN
INTRAMUSCULAR | Status: AC
  Filled 2012-08-25: qty 1

## 2012-08-25 MED ORDER — DIPHENHYDRAMINE HCL 12.5 MG/5ML PO ELIX: 12.5000 mg | ORAL_SOLUTION | Freq: Four times a day (QID) | ORAL | Status: DC | PRN

## 2012-08-25 MED ORDER — MIDAZOLAM HCL 5 MG/5ML IJ SOLN
INTRAMUSCULAR | Status: DC | PRN
  Administered 2012-08-25: 2 mg via INTRAVENOUS

## 2012-08-25 MED ORDER — DEXTROSE 5 % IV SOLN
2.0000 g | Freq: Two times a day (BID) | INTRAVENOUS | Status: AC
  Administered 2012-08-25: 2 g via INTRAVENOUS
  Filled 2012-08-25: qty 2

## 2012-08-25 MED ORDER — HYDROMORPHONE HCL PF 1 MG/ML IJ SOLN
0.5000 mg | INTRAMUSCULAR | Status: DC | PRN
  Administered 2012-08-25 – 2012-08-26 (×6): 1 mg via INTRAVENOUS
  Administered 2012-08-26: 2 mg via INTRAVENOUS
  Administered 2012-08-26 – 2012-08-27 (×6): 1 mg via INTRAVENOUS
  Filled 2012-08-25 (×4): qty 1
  Filled 2012-08-25: qty 2
  Filled 2012-08-25 (×3): qty 1
  Filled 2012-08-25: qty 2
  Filled 2012-08-25 (×4): qty 1

## 2012-08-25 MED ORDER — LIP MEDEX EX OINT
1.0000 "application " | TOPICAL_OINTMENT | Freq: Two times a day (BID) | CUTANEOUS | Status: DC
  Administered 2012-08-25 – 2012-08-29 (×9): 1 via TOPICAL
  Filled 2012-08-25: qty 7

## 2012-08-25 MED ORDER — DEXTROSE 5 % IV SOLN
2.0000 g | INTRAVENOUS | Status: AC
  Administered 2012-08-25: 2 g via INTRAVENOUS
  Filled 2012-08-25: qty 2

## 2012-08-25 MED ORDER — KETOROLAC TROMETHAMINE 30 MG/ML IJ SOLN
INTRAMUSCULAR | Status: DC | PRN
  Administered 2012-08-25: 30 mg via INTRAVENOUS

## 2012-08-25 MED ORDER — DIPHENHYDRAMINE HCL 50 MG/ML IJ SOLN: 12.5000 mg | Freq: Four times a day (QID) | INTRAMUSCULAR | Status: DC | PRN

## 2012-08-25 MED ORDER — DEXAMETHASONE SODIUM PHOSPHATE 10 MG/ML IJ SOLN
INTRAMUSCULAR | Status: DC | PRN
  Administered 2012-08-25: 10 mg via INTRAVENOUS

## 2012-08-25 MED ORDER — PROMETHAZINE HCL 25 MG/ML IJ SOLN: 6.2500 mg | Freq: Four times a day (QID) | INTRAMUSCULAR | Status: DC | PRN

## 2012-08-25 MED ORDER — BUPIVACAINE 0.25 % ON-Q PUMP DUAL CATH 300 ML
300.0000 mL | INJECTION | Status: DC
  Filled 2012-08-25: qty 300

## 2012-08-25 MED ORDER — GLYCOPYRROLATE 0.2 MG/ML IJ SOLN
INTRAMUSCULAR | Status: DC | PRN
  Administered 2012-08-25: 0.6 mg via INTRAVENOUS

## 2012-08-25 MED ORDER — SUCCINYLCHOLINE CHLORIDE 20 MG/ML IJ SOLN
INTRAMUSCULAR | Status: DC | PRN
  Administered 2012-08-25: 100 mg via INTRAVENOUS

## 2012-08-25 MED ORDER — ALUM & MAG HYDROXIDE-SIMETH 200-200-20 MG/5ML PO SUSP: 30.0000 mL | Freq: Four times a day (QID) | ORAL | Status: DC | PRN

## 2012-08-25 MED ORDER — ROCURONIUM BROMIDE 100 MG/10ML IV SOLN
INTRAVENOUS | Status: DC | PRN
  Administered 2012-08-25 (×2): 10 mg via INTRAVENOUS
  Administered 2012-08-25: 50 mg via INTRAVENOUS

## 2012-08-25 MED ORDER — SODIUM CHLORIDE 0.9 % IV SOLN: 250.0000 mL | INTRAVENOUS | Status: DC | PRN

## 2012-08-25 MED ORDER — PROMETHAZINE HCL 25 MG/ML IJ SOLN: 6.2500 mg | INTRAMUSCULAR | Status: DC | PRN

## 2012-08-25 MED ORDER — LIDOCAINE HCL (CARDIAC) 20 MG/ML IV SOLN
INTRAVENOUS | Status: DC | PRN
  Administered 2012-08-25: 100 mg via INTRAVENOUS

## 2012-08-25 SURGICAL SUPPLY — 86 items
APPLIER CLIP 5 13 M/L LIGAMAX5 (MISCELLANEOUS)
APPLIER CLIP ROT 10 11.4 M/L (STAPLE)
BLADE EXTENDED COATED 6.5IN (ELECTRODE) ×2 IMPLANT
BLADE HEX COATED 2.75 (ELECTRODE) ×4 IMPLANT
BLADE SURG SZ10 CARB STEEL (BLADE) ×2 IMPLANT
CABLE HIGH FREQUENCY MONO STRZ (ELECTRODE) ×2 IMPLANT
CANISTER SUCTION 2500CC (MISCELLANEOUS) ×2 IMPLANT
CATH KIT ON Q 7.5IN SLV (PAIN MANAGEMENT) ×4 IMPLANT
CELLS DAT CNTRL 66122 CELL SVR (MISCELLANEOUS) IMPLANT
CLIP APPLIE 5 13 M/L LIGAMAX5 (MISCELLANEOUS) IMPLANT
CLIP APPLIE ROT 10 11.4 M/L (STAPLE) IMPLANT
CLOTH BEACON ORANGE TIMEOUT ST (SAFETY) ×2 IMPLANT
COVER MAYO STAND STRL (DRAPES) ×4 IMPLANT
DECANTER SPIKE VIAL GLASS SM (MISCELLANEOUS) ×2 IMPLANT
DRAIN CHANNEL 19F RND (DRAIN) ×2 IMPLANT
DRAPE LAPAROSCOPIC ABDOMINAL (DRAPES) ×2 IMPLANT
DRAPE LG THREE QUARTER DISP (DRAPES) ×2 IMPLANT
DRAPE UTILITY 15X26 (DRAPE) ×4 IMPLANT
DRAPE WARM FLUID 44X44 (DRAPE) ×4 IMPLANT
DRSG OPSITE POSTOP 4X6 (GAUZE/BANDAGES/DRESSINGS) ×4 IMPLANT
DRSG TEGADERM 2-3/8X2-3/4 SM (GAUZE/BANDAGES/DRESSINGS) ×4 IMPLANT
DRSG TEGADERM 4X4.75 (GAUZE/BANDAGES/DRESSINGS) ×2 IMPLANT
ELECT REM PT RETURN 9FT ADLT (ELECTROSURGICAL) ×2
ELECTRODE REM PT RTRN 9FT ADLT (ELECTROSURGICAL) ×1 IMPLANT
ENDOLOOP SUT PDS II  0 18 (SUTURE) ×1
ENDOLOOP SUT PDS II 0 18 (SUTURE) ×1 IMPLANT
GAUZE SPONGE 2X2 8PLY STRL LF (GAUZE/BANDAGES/DRESSINGS) ×1 IMPLANT
GLOVE BIOGEL PI IND STRL 7.0 (GLOVE) ×1 IMPLANT
GLOVE BIOGEL PI INDICATOR 7.0 (GLOVE) ×1
GLOVE ECLIPSE 8.0 STRL XLNG CF (GLOVE) ×8 IMPLANT
GLOVE INDICATOR 8.0 STRL GRN (GLOVE) ×4 IMPLANT
GOWN STRL NON-REIN LRG LVL3 (GOWN DISPOSABLE) IMPLANT
GOWN STRL REIN XL XLG (GOWN DISPOSABLE) ×18 IMPLANT
KIT BASIN OR (CUSTOM PROCEDURE TRAY) ×2 IMPLANT
LEGGING LITHOTOMY PAIR STRL (DRAPES) ×4 IMPLANT
LIGASURE IMPACT 36 18CM CVD LR (INSTRUMENTS) IMPLANT
LUBRICANT JELLY K Y 4OZ (MISCELLANEOUS) ×2 IMPLANT
NS IRRIG 1000ML POUR BTL (IV SOLUTION) ×2 IMPLANT
PENCIL BUTTON HOLSTER BLD 10FT (ELECTRODE) ×4 IMPLANT
RTRCTR WOUND ALEXIS 18CM MED (MISCELLANEOUS)
SCISSORS LAP 5X35 DISP (ENDOMECHANICALS) ×2 IMPLANT
SEALER TISSUE G2 CVD JAW 35 (ENDOMECHANICALS) IMPLANT
SEALER TISSUE G2 CVD JAW 45CM (ENDOMECHANICALS)
SEALER TISSUE G2 STRG ARTC 35C (ENDOMECHANICALS) ×2 IMPLANT
SET IRRIG TUBING LAPAROSCOPIC (IRRIGATION / IRRIGATOR) ×2 IMPLANT
SLEEVE XCEL OPT CAN 5 100 (ENDOMECHANICALS) ×6 IMPLANT
SPONGE GAUZE 2X2 STER 10/PKG (GAUZE/BANDAGES/DRESSINGS) ×1
SPONGE GAUZE 4X4 12PLY (GAUZE/BANDAGES/DRESSINGS) IMPLANT
SPONGE LAP 18X18 X RAY DECT (DISPOSABLE) ×6 IMPLANT
STAPLER 90 3.5 STAND SLIM (STAPLE) ×2
STAPLER 90 3.5 STD SLIM (STAPLE) ×1 IMPLANT
STAPLER PROXIMATE 75MM BLUE (STAPLE) ×2 IMPLANT
STAPLER VISISTAT 35W (STAPLE) ×2 IMPLANT
SUCTION POOLE TIP (SUCTIONS) ×2 IMPLANT
SUT ETHILON 2 0 PS N (SUTURE) IMPLANT
SUT MNCRL AB 4-0 PS2 18 (SUTURE) ×2 IMPLANT
SUT PDS AB 1 CTX 36 (SUTURE) IMPLANT
SUT PDS AB 1 TP1 96 (SUTURE) ×4 IMPLANT
SUT PDS AB 2-0 CT2 27 (SUTURE) IMPLANT
SUT PDS AB 4-0 PS2 18 (SUTURE) IMPLANT
SUT PROLENE 0 CT 2 (SUTURE) IMPLANT
SUT PROLENE 2 0 CT2 30 (SUTURE) IMPLANT
SUT PROLENE 2 0 KS (SUTURE) IMPLANT
SUT SILK 2 0 (SUTURE) ×1
SUT SILK 2 0 SH CR/8 (SUTURE) ×2 IMPLANT
SUT SILK 2-0 18XBRD TIE 12 (SUTURE) ×1 IMPLANT
SUT SILK 3 0 (SUTURE) ×1
SUT SILK 3 0 SH CR/8 (SUTURE) ×2 IMPLANT
SUT SILK 3-0 18XBRD TIE 12 (SUTURE) ×1 IMPLANT
SUT VIC AB 2-0 SH 18 (SUTURE) IMPLANT
SUT VICRYL 2 0 18  UND BR (SUTURE)
SUT VICRYL 2 0 18 UND BR (SUTURE) IMPLANT
SYS LAPSCP GELPORT 120MM (MISCELLANEOUS) ×2
SYSTEM LAPSCP GELPORT 120MM (MISCELLANEOUS) ×1 IMPLANT
TAPE UMBILICAL COTTON 1/8X30 (MISCELLANEOUS) ×2 IMPLANT
TOWEL OR 17X26 10 PK STRL BLUE (TOWEL DISPOSABLE) ×4 IMPLANT
TOWEL OR NON WOVEN STRL DISP B (DISPOSABLE) ×4 IMPLANT
TRAY FOLEY CATH 14FRSI W/METER (CATHETERS) ×2 IMPLANT
TRAY LAP CHOLE (CUSTOM PROCEDURE TRAY) ×2 IMPLANT
TROCAR XCEL NON-BLD 11X100MML (ENDOMECHANICALS) ×2 IMPLANT
TROCAR XCEL NON-BLD 5MMX100MML (ENDOMECHANICALS) ×2 IMPLANT
TUBING CONNECTING 10 (TUBING) ×2 IMPLANT
TUBING FILTER THERMOFLATOR (ELECTROSURGICAL) ×2 IMPLANT
TUNNELER SHEATH ON-Q 16GX12 DP (PAIN MANAGEMENT) ×2 IMPLANT
YANKAUER SUCT BULB TIP 10FT TU (MISCELLANEOUS) ×2 IMPLANT
YANKAUER SUCT BULB TIP NO VENT (SUCTIONS) ×2 IMPLANT

## 2012-08-25 NOTE — H&P (View-Only) (Signed)
Subjective:     Patient ID: Jason Bray, male   DOB: 11/10/1955, 56 y.o.   MRN: [id number]  HPI  Jason Bray  06/15/1955 [id number]  Patient Care Team: Robert L Fried, MD as PCP - General (Family Medicine) Vincent C. Schooler, MD as Consulting Physician (Gastroenterology) Shanese Riemenschneider C. Elivia Robotham, MD as Consulting Physician (General Surgery)  This patient is a 56 y.o.male who presents today for surgical evaluation at the request of Dr. Schooler.   Reason for visit: Newly diagnosed mid colon masses.  One strongly suspicious for cancer.  Pleasant active male.  No family history of bowel issues aside from probable colon arteriovenous malformations controlled with cautery on his father.  Had screening colonoscopy.  Found to have large colon polyp in upper descending colon.  Found to have larger circumferential mass in proximal transverse colon very suspicious for cancer.  Biopsies pending.  Surgical consultation requested.  Patient had an episode of chest pain and discomfort.  Negative cardiac workup by Dr. Nation 2011.  Walks on a treadmill 2-1/2 miles a day without difficulty.  No exertional chest/neck/shoulder/arm pain. Daily bowel movements.  No bleeding.  He comes today with his wife.  No personal nor family history of GI/colon cancer, inflammatory bowel disease, irritable bowel syndrome, allergy such as Celiac Sprue, dietary/dairy problems, colitis, ulcers nor gastritis.  No recent sick contacts/gastroenteritis.  No travel outside the country.  No changes in diet.  No abdominal surgeries.    Patient Active Problem List   Diagnosis Date Noted  . 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 Adenomatous colon polyp - prox descending colon 08/09/2012  . HYPERLIPIDEMIA 07/09/2009  . ANXIETY 07/09/2009  . CHEST PAIN 07/09/2009    Past Medical History  Diagnosis Date  . Hyperlipidemia     Past Surgical History  Procedure Laterality Date  . Cardiac catheterization  06/2009    non  cardiac    History   Social History  . Marital Status: Married    Spouse Name: N/A    Number of Children: N/A  . Years of Education: N/A   Occupational History  . Not on file.   Social History Main Topics  . Smoking status: Former Smoker    Types: Cigarettes    Quit date: 01/19/2007  . Smokeless tobacco: Former User    Quit date: 08/10/2006  . Alcohol Use: 2.4 oz/week    4 Glasses of wine per week     Comment: Weekly.  . Drug Use: No  . Sexually Active: Not on file   Other Topics Concern  . Not on file   Social History Narrative  . No narrative on file    Family History  Problem Relation Age of Onset  . Lymphoma Mother   . Hypertension Father   . Hyperlipidemia Father   . Hypertension Brother     Current Outpatient Prescriptions  Medication Sig Dispense Refill  . aspirin 325 MG tablet Take 325 mg by mouth daily.      . fish oil-omega-3 fatty acids 1000 MG capsule Take 2 g by mouth 3 (three) times daily.      . metoprolol tartrate (LOPRESSOR) 25 MG tablet Take 1 tablet (25 mg total) by mouth 2 (two) times daily.  60 tablet  12  . Misc Natural Products (OSTEO BI-FLEX ADV JOINT SHIELD) TABS Take by mouth.      . simvastatin (ZOCOR) 20 MG tablet Take 1 tablet (20 mg total) by mouth at bedtime.  30 tablet    12  . metroNIDAZOLE (FLAGYL) 500 MG tablet Take 1 tablet (500 mg total) by mouth as directed. Take 2 pills (=1000mg) by mouth at 1pm, 3pm, and 10pm the day before your colorectal operation as discussed in [facility] office.  Call [phone] with questions  6 tablet  0  . neomycin (MYCIFRADIN) 500 MG tablet Take 2 tablets (1,000 mg total) by mouth as directed. Take 2 pills (=1000mg) by mouth at 1pm, 3pm, and 10pm the day before your colorectal operation as discussed in [facility] office.  Call [phone] with questions  6 tablet  0  . ranitidine (ZANTAC) 300 MG capsule Take 300 mg by mouth every evening.       No current facility-administered medications for this visit.      No Known Allergies  BP 138/82  Pulse 86  Temp(Src) 98.4 F (36.9 C) (Temporal)  Resp 18  Ht 5' 7" (1.702 m)  Wt 174 lb 6.4 oz (79.107 kg)  BMI 27.31 kg/m2  No results found.   Review of Systems  Constitutional: Negative for fever, chills and diaphoresis.  HENT: Negative for nosebleeds, sore throat, facial swelling, mouth sores, trouble swallowing and ear discharge.   Eyes: Negative for photophobia, discharge and visual disturbance.  Respiratory: Negative for choking, chest tightness, shortness of breath and stridor.   Cardiovascular: Negative for chest pain and palpitations.  Gastrointestinal: Negative for nausea, vomiting, abdominal pain, diarrhea, constipation, blood in stool, abdominal distention, anal bleeding and rectal pain.  Genitourinary: Negative for dysuria, urgency, difficulty urinating and testicular pain.  Musculoskeletal: Negative for myalgias, back pain, arthralgias and gait problem.  Skin: Negative for color change, pallor, rash and wound.  Neurological: Negative for dizziness, speech difficulty, weakness, numbness and headaches.  Hematological: Negative for adenopathy. Does not bruise/bleed easily.  Psychiatric/Behavioral: Negative for hallucinations, confusion and agitation.       Objective:   Physical Exam  Constitutional: He is oriented to person, place, and time. He appears well-developed and well-nourished. No distress.  HENT:  Head: Normocephalic.  Mouth/Throat: Oropharynx is clear and moist. No oropharyngeal exudate.  Eyes: Conjunctivae, EOM and lids are normal. Pupils are equal, round, and reactive to light. No scleral icterus.  Neck: Normal range of motion. Neck supple. No tracheal deviation present.  Cardiovascular: Normal rate, regular rhythm and intact distal pulses.   Pulmonary/Chest: Effort normal and breath sounds normal. No respiratory distress.  Abdominal: Soft. He exhibits no distension. There is no tenderness. Hernia confirmed negative  in the right inguinal area and confirmed negative in the left inguinal area.  Musculoskeletal: Normal range of motion. He exhibits no tenderness.  Lymphadenopathy:    He has no cervical adenopathy.       Right: No inguinal adenopathy present.       Left: No inguinal adenopathy present.  Neurological: He is alert and oriented to person, place, and time. No cranial nerve deficit. He exhibits normal muscle tone. Coordination normal.  Skin: Skin is warm and dry. No rash noted. He is not diaphoretic. No erythema. No pallor.  Psychiatric: He has a normal mood and affect. His behavior is normal. Judgment and thought content normal.       Assessment:     Two masses in the mid colon.  Transverse colon strongly suspicious for cancer.  Proximal descending colon consistent with large adenomatous polyp.  Pathology pending.  Need for resection.     Plan:     I recommended the laparoscopic possible open resection.  He may need a   moderate amount of his colon removed.  We will get pathology results.  If cancer confirmed, order CEA & preoperative CT of chest/abdomen/pelvis with oral and IV contrast per protocol.  I discussed the procedure at length with the patient and his wife:  The anatomy & physiology of the digestive tract was discussed.  The pathophysiology was discussed.  Natural history risks without surgery was discussed.   I feel the risks of no intervention will lead to serious problems that outweigh the operative risks; therefore, I recommended a partial colectomy to remove the pathology.  Laparoscopic & open techniques were discussed.   Risks such as bleeding, infection, abscess, leak, reoperation, possible ostomy, hernia, heart attack, death, and other risks were discussed.  I noted a good likelihood this will help address the problem.   Goals of post-operative recovery were discussed as well.  We will work to minimize complications.  An educational handout on the pathology was given as well.   Questions were answered.  The patient & wife expressed understanding & wishes to proceed with surgery ASAP.        

## 2012-08-25 NOTE — Transfer of Care (Signed)
Immediate Anesthesia Transfer of Care Note  Patient: Jason Bray  Procedure(s) Performed: Procedure(s) (LRB): LAPAROSCOPIC PARTIAL COLECTOMY   (N/A)  Patient Location: PACU  Anesthesia Type: General  Level of Consciousness: sedated, patient cooperative and responds to stimulaton  Airway & Oxygen Therapy: Patient Spontanous Breathing and Patient connected to face mask oxgen  Post-op Assessment: Report given to PACU RN and Post -op Vital signs reviewed and stable  Post vital signs: Reviewed and stable  Complications: No apparent anesthesia complications

## 2012-08-25 NOTE — Anesthesia Postprocedure Evaluation (Signed)
  Anesthesia Post-op Note  Patient: Jason Bray  Procedure(s) Performed: Procedure(s) (LRB): LAPAROSCOPIC PARTIAL COLECTOMY   (N/A)  Patient Location: PACU  Anesthesia Type: General  Level of Consciousness: awake and alert   Airway and Oxygen Therapy: Patient Spontanous Breathing  Post-op Pain: mild  Post-op Assessment: Post-op Vital signs reviewed, Patient's Cardiovascular Status Stable, Respiratory Function Stable, Patent Airway and No signs of Nausea or vomiting  Last Vitals:  Filed Vitals:   08/25/12 1455  BP: 130/75  Pulse: 84  Temp: 36.5 C  Resp: 12    Post-op Vital Signs: stable   Complications: No apparent anesthesia complications

## 2012-08-25 NOTE — Anesthesia Preprocedure Evaluation (Addendum)
Anesthesia Evaluation  Patient identified by MRN, date of birth, ID band Patient awake    Reviewed: Allergy & Precautions, H&P , NPO status , Patient's Chart, lab work & pertinent test results  Airway Mallampati: II TM Distance: >3 FB Neck ROM: Full    Dental no notable dental hx.    Pulmonary neg pulmonary ROS,  breath sounds clear to auscultation  Pulmonary exam normal       Cardiovascular Exercise Tolerance: Good + angina + dysrhythmias Rhythm:Regular Rate:Normal  ECG reviewed. One episode in 2011 with negative workup by Dr. Eden Emms. Released by Dr. Eden Emms.   Very active. Treadmill daily without symptoms.   Neuro/Psych Anxiety negative neurological ROS     GI/Hepatic Neg liver ROS, GERD-  ,  Endo/Other  negative endocrine ROS  Renal/GU negative Renal ROS  negative genitourinary   Musculoskeletal negative musculoskeletal ROS (+)   Abdominal   Peds negative pediatric ROS (+)  Hematology negative hematology ROS (+)   Anesthesia Other Findings   Reproductive/Obstetrics negative OB ROS                          Anesthesia Physical Anesthesia Plan  ASA: II  Anesthesia Plan: General   Post-op Pain Management:    Induction: Intravenous  Airway Management Planned: Oral ETT  Additional Equipment:   Intra-op Plan:   Post-operative Plan: Extubation in OR  Informed Consent: I have reviewed the patients History and Physical, chart, labs and discussed the procedure including the risks, benefits and alternatives for the proposed anesthesia with the patient or authorized representative who has indicated his/her understanding and acceptance.   Dental advisory given  Plan Discussed with: CRNA  Anesthesia Plan Comments:         Anesthesia Quick Evaluation

## 2012-08-25 NOTE — Preoperative (Signed)
Beta Blockers   Reason not to administer Beta Blockers:Not Applicable Pt took beta blocker today

## 2012-08-25 NOTE — Op Note (Signed)
08/25/2012  1:22 PM  PATIENT:  Jason Bray  57 y.o. male  Patient Care Team: Lenora Boys, MD as PCP - General (Family Medicine) Shirley Friar, MD as Consulting Physician (Gastroenterology) Ardeth Sportsman, MD as Consulting Physician (General Surgery) Wendall Stade, MD as Consulting Physician (Cardiology)  PRE-OPERATIVE DIAGNOSIS:  transverse & descending colon masses  POST-OPERATIVE DIAGNOSIS:  cancer hepatic flexure and polyp mid transverse colon  PROCEDURE:  Procedure(s): LAPAROSCOPIC PARTIAL COLECTOMY    SURGEON:  Surgeon(s): Ardeth Sportsman, MD Romie Levee, MD - Asst  ANESTHESIA:   local and general  EBL:  Total I/O In: 2300 [I.V.:2300] Out: 500 [Urine:400; Blood:100]  Delay start of Pharmacological VTE agent (>24hrs) due to surgical blood loss or risk of bleeding:  no  DRAINS: none   SPECIMEN:  Source of Specimen:  Proximal colon (ileum to distal transverse colon)  DISPOSITION OF SPECIMEN:  PATHOLOGY  COUNTS:  YES  PLAN OF CARE: Admit to inpatient   PATIENT DISPOSITION:  PACU - hemodynamically stable.  INDICATION:    Patient with cancer in transverse colon & large polyp distal to that - both tattooed.  I recommended segmental resection:  The anatomy & physiology of the digestive tract was discussed.  The pathophysiology was discussed.  Natural history risks without surgery was discussed.   I worked to give an overview of the disease and the frequent need to have multispecialty involvement.  I feel the risks of no intervention will lead to serious problems that outweigh the operative risks; therefore, I recommended a partial colectomy to remove the pathology.  Laparoscopic & open techniques were discussed.   Risks such as bleeding, infection, abscess, leak, reoperation, possible ostomy, hernia, heart attack, death, and other risks were discussed.  I noted a good likelihood this will help address the problem.   Goals of post-operative recovery were  discussed as well.  We will work to minimize complications.  An educational handout on the pathology was given as well.  Questions were answered.    The patient expresses understanding & wishes to proceed with surgery.  OR FINDINGS:   Patient had large mass at hepatic flexure & 3cm polyp in mid-transverse colon  No obvious metastatic disease on visceral parietal peritoneum or liver.  It is an ileocolonic anastomosis that rests in the central abdomen.  DESCRIPTION:   Informed consent was confirmed.  The patient underwent general anaesthesia without difficulty.  The patient was positioned appropriately.  VTE prevention in place.  The patient's abdomen was clipped, prepped, & draped in a sterile fashion.  Surgical timeout confirmed our plan.  The patient was positioned in reverse Trendelenburg.  Abdominal entry was gained using optical entry technique in the left upper abdomen.  Entry was clean.  I induced carbon dioxide insufflation.  Camera inspection revealed no injury.  Extra ports were carefully placed under direct laparoscopic visualization.  We can locate the two tattooed areas.  One was at the hepatic flexure consistent where the CAT scan and noted.  The other tattoo is in the mid transverse colon.  I mobilized & reflected the greater omentum and small bowel in the upper abdomen.  I was able to elevate the proximal colon to isolate the ileocolonic pedicle.  I scored the ileal mesentery just proximal to that.   I carried that further dissection in a medial to lateral fashion.  I was able to bluntly get into the retro-mesenteric plane on the right side.  I freed the proximal right  sided colonic mesentery off the duodenal sweep, pancreatic head, & Gerota's fascia of the right kidney. I was able to get underneath the hepatic flexure.  I was able to get underneath the proximal and mid transverse colon.  I then proceeded to mobilize in a lateral to medial fashion.  I mobilized the distal ileal  mesentery off its retroperitoneal and pelvic attachments.  I mobilized the ascending colon off It is side wall attachments to the paracolic gutter and retroperitoneum.  I also mobilized the greater omentum off the mid transverse colon and mobilized the mid to proximal transverse colon in a superior to inferior fashion.  This allowed me to mobilize the hepatic flexure and get a complete mobilization of the proximal "right" colon.  I continued releasing mostly greater omentum off the mid transverse colon where the tattoo was seen.  I placed a GelPort has a wound protector through a supraumbilical midline 6cm incision in the periumbilical region.  I did have to extend a few centimeters since his abdominal was thickened and has mesentery was thickened.  With that, I was able to eviserate the distal ileum and most of the transverse proximal colon.  I could isolate the pathology. When he went ahead and proceeded with transection.  I kept a healthy pedicle on the ileal and left middle colic side.  We took the intervening mesentery using clamps and ties as well as bipolar energy.  We assured hemostasis.   I did a side-to-side stapled anastomosis of ileum to distal transverse colon using a 75mm GIA stapler.  We then transected off the common bowel defect using a TX-90 stapler.  I closed off the common mesenteric defect using interrupted silk stitches to avoid any internal hernias.     We did reinspection of the abdomen.  Hemostasis was good.   Ureters, retroperitoneum, and bowel uninjured.  The anastomosis looked healthy.  We did copious irrigation with numerous liters of crystalloid.  We did a final irrigation of antibiotic solution (900 mg clindamycin/240 mg gentamicin in a liter of crystalloid) & held that for 10 minutes while we removed the wound protector of the Gelport & changed gown & gloves.     We aspirated the antibiotic irrigation.  We reinspected the abdomen.  Hemostasis was good.   No injury.  The  anastomosis looked healthy.  I placed On-Q catheter and sheaths into the preperitoneal space under direct palpation.  I closed the 5mm port sites using Monocryl stitch and sterile dressing.  Closed the midline incision using #1 PDS running closure. I closed the skin with some interrupted Monocryl stitches. I placed betadine-soaked wicks in between those areas. I placed sterile dressing.  OnQ catheters placed & sheaths peeled away.  Patient is being extubated go to recovery room. I discussed postop care with the patient in detail the office & in the holding area. Instructions are written. I'm about to locate family and discuss it with them as well.

## 2012-08-25 NOTE — Interval H&P Note (Signed)
History and Physical Interval Note:  08/25/2012 9:38 AM  Jason Bray  has presented today for surgery, with the diagnosis of transverse colon mass  The various methods of treatment have been discussed with the patient and family. After consideration of risks, benefits and other options for treatment, the patient has consented to  Procedure(s): LAPAROSCOPIC PARTIAL COLECTOMY possible open  (N/A) as a surgical intervention .  The patient's history has been reviewed, patient examined, no change in status, stable for surgery.  I have reviewed the patient's chart and labs.  Questions were answered to the patient's satisfaction.     Verba Ainley C.

## 2012-08-26 LAB — CBC
MCV: 67.4 fL — ABNORMAL LOW (ref 78.0–100.0)
Platelets: 738 10*3/uL — ABNORMAL HIGH (ref 150–400)
RBC: 4.27 MIL/uL (ref 4.22–5.81)
WBC: 19 10*3/uL — ABNORMAL HIGH (ref 4.0–10.5)

## 2012-08-26 LAB — BASIC METABOLIC PANEL
CO2: 27 mEq/L (ref 19–32)
Calcium: 8.5 mg/dL (ref 8.4–10.5)
GFR calc Af Amer: 89 mL/min — ABNORMAL LOW (ref >90)
GFR calc non Af Amer: 77 mL/min — ABNORMAL LOW (ref >90)
Sodium: 137 mEq/L (ref 135–145)

## 2012-08-26 LAB — MAGNESIUM: Magnesium: 2.1 mg/dL (ref 1.5–2.5)

## 2012-08-26 MED ORDER — DEXTROSE-NACL 5-0.9 % IV SOLN
INTRAVENOUS | Status: DC
  Administered 2012-08-26 – 2012-08-28 (×4): via INTRAVENOUS

## 2012-08-26 NOTE — Progress Notes (Signed)
1 Day Post-Op  Subjective: Sore.  Walked some.  Objective: Vital signs in last 24 hours: Temp:  [97.4 F (36.3 C)-98 F (36.7 C)] 97.9 F (36.6 C) (05/17 0450) Pulse Rate:  [69-85] 77 (05/17 0450) Resp:  [7-18] 17 (05/17 0450) BP: (120-164)/(75-95) 122/79 mmHg (05/17 0450) SpO2:  [97 %-100 %] 98 % (05/17 0450) Weight:  [174 lb 6.4 oz (79.107 kg)-178 lb 12.7 oz (81.1 kg)] 178 lb 12.7 oz (81.1 kg) (05/17 0600) Last BM Date: 08/24/12  Intake/Output from previous day: 05/16 0701 - 05/17 0700 In: 3520.8 [P.O.:240; I.V.:3280.8] Out: 1025 [Urine:925; Blood:100] Intake/Output this shift:    PE: General- In NAD Abdomen-soft, dressing dry, positive bowel sounds.  Lab Results:   Recent Labs  08/26/12 0500  WBC 19.0*  HGB 7.9*  HCT 28.8*  PLT 738*   BMET  Recent Labs  08/26/12 0500  NA 137  K 5.6*  CL 103  CO2 27  GLUCOSE 131*  BUN 14  CREATININE 1.05  CALCIUM 8.5   PT/INR No results found for this basename: LABPROT, INR,  in the last 72 hours Comprehensive Metabolic Panel:    Component Value Date/Time   NA 137 08/26/2012 0500   K 5.6* 08/26/2012 0500   CL 103 08/26/2012 0500   CO2 27 08/26/2012 0500   BUN 14 08/26/2012 0500   CREATININE 1.05 08/26/2012 0500   CREATININE 0.88 08/11/2012 1603   GLUCOSE 131* 08/26/2012 0500   CALCIUM 8.5 08/26/2012 0500   AST 24 06/17/2009 1358   ALT 23 06/17/2009 1358   ALKPHOS 63 06/17/2009 1358   BILITOT 1.1 06/17/2009 1358   PROT 7.7 06/17/2009 1358   ALBUMIN 4.1 06/17/2009 1358     Studies/Results: No results found.  Anti-infectives: Anti-infectives   Start     Dose/Rate Route Frequency Ordered Stop   08/25/12 1600  cefoTEtan (CEFOTAN) 2 g in dextrose 5 % 50 mL IVPB     2 g 100 mL/hr over 30 Minutes Intravenous Every 12 hours 08/25/12 1502 08/25/12 1653   08/25/12 0830  clindamycin (CLEOCIN) 900 mg, gentamicin (GARAMYCIN) 240 mg in sodium chloride 0.9 % 1,000 mL for intraperitoneal lavage  Status:  Discontinued     Intraperitoneal To Surgery 08/25/12 0815 08/25/12 1448   08/25/12 0815  cefoTEtan (CEFOTAN) 2 g in dextrose 5 % 50 mL IVPB     2 g 100 mL/hr over 30 Minutes Intravenous On call to O.R. 08/25/12 0815 08/25/12 1029      Assessment Principal Problem:   Cancer of transverse colon-s/p lap assisted partial colectomy 08/25/12-stable overnight   Anemia-acute of chronic:  Hemoglobin down to 7.9   Hyperkalemia-IVF changed.   LOS: 1 day   Plan: Remove foley.  Full liquids.  Recheck lab tomorrow.   Jason Bray 08/26/2012

## 2012-08-26 NOTE — Progress Notes (Addendum)
Changed fluid to D5NS per order due to K+ 5.8.  Urine output just over limit to call MD.  375cc out for 12 hrs.  Hgb down to 7.9 from 10 preop.  Will leave foley in until discuss with MD. Erick Colace Will also hold off on starting heparin SQ this am until speak with MD due to low hgb and abnormally elevated platelet level. Barnett Hatter P

## 2012-08-27 LAB — BASIC METABOLIC PANEL
Chloride: 100 mEq/L (ref 96–112)
GFR calc Af Amer: >90 mL/min (ref >90)
Potassium: 3.7 mEq/L (ref 3.5–5.1)
Sodium: 135 mEq/L (ref 135–145)

## 2012-08-27 LAB — CBC
HCT: 21.4 % — ABNORMAL LOW (ref 39.0–52.0)
Hemoglobin: 6.1 g/dL — CL (ref 13.0–17.0)
RBC: 3.22 MIL/uL — ABNORMAL LOW (ref 4.22–5.81)
RDW: 17 % — ABNORMAL HIGH (ref 11.5–15.5)
WBC: 13.4 10*3/uL — ABNORMAL HIGH (ref 4.0–10.5)

## 2012-08-27 LAB — TYPE AND SCREEN
ABO/RH(D): O NEG
Antibody Screen: NEGATIVE

## 2012-08-27 MED ORDER — ACETAMINOPHEN 325 MG PO TABS
ORAL_TABLET | ORAL | Status: AC
  Filled 2012-08-27: qty 2

## 2012-08-27 MED ORDER — METOPROLOL SUCCINATE ER 50 MG PO TB24
50.0000 mg | ORAL_TABLET | Freq: Every day | ORAL | Status: DC
  Administered 2012-08-27 – 2012-08-29 (×3): 50 mg via ORAL
  Filled 2012-08-27 (×3): qty 1

## 2012-08-27 MED ORDER — ACETAMINOPHEN 325 MG PO TABS
650.0000 mg | ORAL_TABLET | ORAL | Status: DC | PRN
  Administered 2012-08-27: 650 mg via ORAL

## 2012-08-27 MED ORDER — METOPROLOL SUCCINATE ER 50 MG PO TB24: 50.0000 mg | ORAL_TABLET | Freq: Every day | ORAL | Status: DC

## 2012-08-27 NOTE — Progress Notes (Signed)
CRITICAL VALUE ALERT  Critical value received:  hgb 6.1  Date of notification:  08/27/12  Time of notification:  0533  Critical value read back:yes  Nurse who received alert:  Pernell Dupre, RN  MD notified (1st page):  Dr Johna Sheriff  Time of first page:  0542  MD notified (2nd page):  Time of second page:  Responding MD:  Dr Johna Sheriff  Time MD responded: [phone]

## 2012-08-27 NOTE — Progress Notes (Signed)
pts hbg called to Dr Johna Sheriff of 6.1. Order given to transfuse 2u PRBC.  Patient with complaints of headache on pain assessment.  Order given for tylenol 650mg  PO per order.  Plan of care discussed with patient and spouse and they verbalized understanding.  Call light within reach.

## 2012-08-27 NOTE — Progress Notes (Signed)
2 Days Post-Op  Subjective: Still quite sore.  Walking.  No flatus or BM.  Objective: Vital signs in last 24 hours: Temp:  [97.4 F (36.3 C)-99 F (37.2 C)] 99 F (37.2 C) (05/18 0530) Pulse Rate:  [79-102] 102 (05/18 0530) Resp:  [16-18] 18 (05/18 0530) BP: (117-156)/(68-85) 145/85 mmHg (05/18 0530) SpO2:  [92 %-98 %] 98 % (05/18 0530) Weight:  [182 lb 1.6 oz (82.6 kg)] 182 lb 1.6 oz (82.6 kg) (05/18 0500) Last BM Date: 08/24/12  Intake/Output from previous day: 05/17 0701 - 05/18 0700 In: 1792.5 [P.O.:600; I.V.:1192.5] Out: 3650 [Urine:3650] Intake/Output this shift:    PE: General- In NAD Abdomen-soft, dressing dry, active bowel sounds.  Lab Results:   Recent Labs  08/26/12 0500 08/27/12 0437  WBC 19.0* 13.4*  HGB 7.9* 6.1*  HCT 28.8* 21.4*  PLT 738* 552*   BMET  Recent Labs  08/26/12 0500 08/27/12 0437  NA 137 135  K 5.6* 3.7  CL 103 100  CO2 27 32  GLUCOSE 131* 105*  BUN 14 8  CREATININE 1.05 0.81  CALCIUM 8.5 8.3*   PT/INR No results found for this basename: LABPROT, INR,  in the last 72 hours Comprehensive Metabolic Panel:    Component Value Date/Time   NA 135 08/27/2012 0437   K 3.7 08/27/2012 0437   CL 100 08/27/2012 0437   CO2 32 08/27/2012 0437   BUN 8 08/27/2012 0437   CREATININE 0.81 08/27/2012 0437   CREATININE 0.88 08/11/2012 1603   GLUCOSE 105* 08/27/2012 0437   CALCIUM 8.3* 08/27/2012 0437   AST 24 06/17/2009 1358   ALT 23 06/17/2009 1358   ALKPHOS 63 06/17/2009 1358   BILITOT 1.1 06/17/2009 1358   PROT 7.7 06/17/2009 1358   ALBUMIN 4.1 06/17/2009 1358     Studies/Results: No results found.  Anti-infectives: Anti-infectives   Start     Dose/Rate Route Frequency Ordered Stop   08/25/12 1600  cefoTEtan (CEFOTAN) 2 g in dextrose 5 % 50 mL IVPB     2 g 100 mL/hr over 30 Minutes Intravenous Every 12 hours 08/25/12 1502 08/25/12 1653   08/25/12 0830  clindamycin (CLEOCIN) 900 mg, gentamicin (GARAMYCIN) 240 mg in sodium chloride 0.9 % 1,000 mL  for intraperitoneal lavage  Status:  Discontinued      Intraperitoneal To Surgery 08/25/12 0815 08/25/12 1448   08/25/12 0815  cefoTEtan (CEFOTAN) 2 g in dextrose 5 % 50 mL IVPB     2 g 100 mL/hr over 30 Minutes Intravenous On call to O.R. 08/25/12 0815 08/25/12 1029      Assessment Principal Problem:   Cancer of transverse colon-s/p lap assisted partial colectomy 08/25/12-tolerating full liquid diet.   Anemia-acute on chronic:  Hemoglobin down to 6.1, transfusion ordered   Hyperkalemia-resolved.   LOS: 2 days   Plan: Advance diet.  Check lab tomorrow.   Jewel Mcafee J 08/27/2012

## 2012-08-28 ENCOUNTER — Encounter (HOSPITAL_COMMUNITY): Payer: Self-pay | Admitting: Surgery

## 2012-08-28 ENCOUNTER — Encounter (INDEPENDENT_AMBULATORY_CARE_PROVIDER_SITE_OTHER): Payer: Self-pay | Admitting: Surgery

## 2012-08-28 LAB — BASIC METABOLIC PANEL
Calcium: 8.6 mg/dL (ref 8.4–10.5)
Chloride: 100 mEq/L (ref 96–112)
Creatinine, Ser: 0.71 mg/dL (ref 0.50–1.35)
GFR calc Af Amer: >90 mL/min (ref >90)

## 2012-08-28 LAB — TYPE AND SCREEN
ABO/RH(D): O NEG
Antibody Screen: NEGATIVE
Unit division: 0

## 2012-08-28 LAB — CBC
MCH: 21.1 pg — ABNORMAL LOW (ref 26.0–34.0)
MCV: 70.1 fL — ABNORMAL LOW (ref 78.0–100.0)
Platelets: 471 10*3/uL — ABNORMAL HIGH (ref 150–400)
RDW: 19.9 % — ABNORMAL HIGH (ref 11.5–15.5)
WBC: 10.7 10*3/uL — ABNORMAL HIGH (ref 4.0–10.5)

## 2012-08-28 MED ORDER — SODIUM CHLORIDE 0.9 % IJ SOLN
3.0000 mL | Freq: Two times a day (BID) | INTRAMUSCULAR | Status: DC
  Administered 2012-08-28: 3 mL via INTRAVENOUS

## 2012-08-28 MED ORDER — FUROSEMIDE 40 MG PO TABS
40.0000 mg | ORAL_TABLET | Freq: Once | ORAL | Status: AC
  Administered 2012-08-28: 40 mg via ORAL
  Filled 2012-08-28: qty 1

## 2012-08-28 MED ORDER — HYDROMORPHONE HCL PF 1 MG/ML IJ SOLN: 0.5000 mg | INTRAMUSCULAR | Status: DC | PRN

## 2012-08-28 MED ORDER — LACTATED RINGERS IV BOLUS (SEPSIS): 1000.0000 mL | Freq: Three times a day (TID) | INTRAVENOUS | Status: DC | PRN

## 2012-08-28 MED ORDER — SODIUM CHLORIDE 0.9 % IJ SOLN: 3.0000 mL | INTRAMUSCULAR | Status: DC | PRN

## 2012-08-28 MED ORDER — ACETAMINOPHEN 325 MG PO TABS: 325.0000 mg | ORAL_TABLET | Freq: Four times a day (QID) | ORAL | Status: DC | PRN

## 2012-08-28 MED ORDER — OXYCODONE HCL 5 MG PO TABS
5.0000 mg | ORAL_TABLET | ORAL | Status: DC | PRN
  Administered 2012-08-28: 10 mg via ORAL
  Filled 2012-08-28: qty 2

## 2012-08-28 NOTE — Progress Notes (Signed)
Jason Bray [id number] Nov 05, 1955  CARE TEAM:  PCP: Lenora Boys, MD  Outpatient Care Team: Patient Care Team: Lenora Boys, MD as PCP - General (Family Medicine) Shirley Friar, MD as Consulting Physician (Gastroenterology) Ardeth Sportsman, MD as Consulting Physician (General Surgery) Wendall Stade, MD as Consulting Physician (Cardiology)  Inpatient Treatment Team: Treatment Team: Attending Provider: Ardeth Sportsman, MD; Registered Nurse: Sydell Axon, RN; Technician: Vella Raring, NT; Registered Nurse: Leonie Douglas, RN   Subjective:  Sore with cough Walking in hallways Appetite fair but no N/V Mild headache now gone Transfused for low Hgb   Objective:  Vital signs:  Filed Vitals:   08/27/12 1615 08/27/12 1715 08/27/12 2130 08/28/12 0520  BP: 150/84 145/81 135/82 132/76  Pulse: 99 94 85 88  Temp: 99.1 F (37.3 C) 98.9 F (37.2 C) 98.2 F (36.8 C) 98.9 F (37.2 C)  TempSrc: Oral Oral Oral Oral  Resp: 16 18 18 18   Height:      Weight:      SpO2: 98%  94% 96%    Last BM Date: 08/27/12  Intake/Output   Yesterday:  05/18 0701 - 05/19 0700 In: 2288.5 [P.O.:240; I.V.:1365; Blood:683.5] Out: 975 [Urine:975] This shift:     Bowel function:  Flatus: y  BM: y  Drain: n/a  Physical Exam:  General: Pt awake/alert/oriented x4 in no acute distress.  Smiling Eyes: PERRL, normal EOM.  Sclera clear.  No icterus Neuro: CN II-XII intact w/o focal sensory/motor deficits. Lymph: No head/neck/groin lymphadenopathy Psych:  No delerium/psychosis/paranoia HENT: Normocephalic, Mucus membranes moist.  No thrush Neck: Supple, No tracheal deviation Chest: No chest wall pain w good excursion CV:  Pulses intact.  Regular rhythm MS: Normal AROM mjr joints.  No obvious deformity Abdomen: Soft.  Nondistended.  Dressings/wicks/OnQ removed.  Mildly tender at incisions only.  No evidence of peritonitis.  No incarcerated hernias. Ext:  SCDs  BLE.  No mjr edema.  No cyanosis Skin: No petechiae / purpura   Problem List:   Principal Problem:   Cancer of transverse colon Active Problems:   ANXIETY   Adenomatous colon polyp - prox descending colon   Assessment  Jason Bray  57 y.o. male  3 Days Post-Op  Procedure(s): LAPAROSCOPIC PARTIAL COLECTOMY    Recovering  Plan:  -d/c IVF -adv diet -diuresis -switch to oral pain control -VTE prophylaxis- SCDs, etc.  Hold on Heparin w anemia & drop in Hgb -mobilize as tolerated to help recovery  D/C patient from hospital when patient meets criteria (anticipate in 1-2 day(s)):  Tolerating oral intake well Ambulating in walkways Adequate pain control without IV medications Urinating  Having flatus    Ardeth Sportsman, M.D., F.A.C.S. Gastrointestinal and Minimally Invasive Surgery [facility] [address], [address] [place], [place] [address] [phone] Main / Paging   08/28/2012   Results:   Labs: Results for orders placed during the hospital encounter of 08/25/12 (from the past 48 hour(s))  BASIC METABOLIC PANEL     Status: Abnormal   Collection Time    08/27/12  4:37 AM      Result Value Range   Sodium 135  135 - 145 mEq/L   Potassium 3.7  3.5 - 5.1 mEq/L   Comment: DELTA CHECK NOTED     REPEATED TO VERIFY   Chloride 100  96 - 112 mEq/L   CO2 32  19 - 32 mEq/L   Glucose, Bld 105 (*)  70 - 99 mg/dL   BUN 8  6 - 23 mg/dL   Creatinine, Ser 1.30  0.50 - 1.35 mg/dL   Calcium 8.3 (*) 8.4 - 10.5 mg/dL   GFR calc non Af Amer >90  >90 mL/min   GFR calc Af Amer >90  >90 mL/min   Comment:            The eGFR has been calculated     using the CKD EPI equation.     This calculation has not been     validated in all clinical     situations.     eGFR's persistently     <90 mL/min signify     possible Chronic Kidney Disease.  CBC     Status: Abnormal   Collection Time    08/27/12  4:37 AM      Result Value Range   WBC 13.4  (*) 4.0 - 10.5 K/uL   RBC 3.22 (*) 4.22 - 5.81 MIL/uL   Hemoglobin 6.1 (*) 13.0 - 17.0 g/dL   Comment: REPEATED TO VERIFY     CRITICAL RESULT CALLED TO, READ BACK BY AND VERIFIED WITH:     LEPPERSON RN AT 0534 ON [id number] BY DLONG     DELTA CHECK NOTED   HCT 21.4 (*) 39.0 - 52.0 %   MCV 66.5 (*) 78.0 - 100.0 fL   MCH 18.9 (*) 26.0 - 34.0 pg   MCHC 28.5 (*) 30.0 - 36.0 g/dL   RDW 69.6 (*) 29.5 - 28.4 %   Platelets 552 (*) 150 - 400 K/uL   Comment: SPECIMEN CHECKED FOR CLOTS     REPEATED TO VERIFY     DELTA CHECK NOTED  PREPARE RBC (CROSSMATCH)     Status: None   Collection Time    08/27/12  5:58 AM      Result Value Range   Order Confirmation ORDER PROCESSED BY BLOOD BANK    TYPE AND SCREEN     Status: None   Collection Time    08/27/12  7:55 AM      Result Value Range   ABO/RH(D) O NEG     Antibody Screen NEG     Sample Expiration 08/30/2012     Unit Number [id number]     Blood Component Type RBC CPDA1, LR     Unit division 00     Status of Unit ISSUED,FINAL     Transfusion Status OK TO TRANSFUSE     Crossmatch Result Compatible     Unit Number [id number]     Blood Component Type RBC LR PHER2     Unit division 00     Status of Unit ISSUED,FINAL     Transfusion Status OK TO TRANSFUSE     Crossmatch Result Compatible    CBC     Status: Abnormal   Collection Time    08/28/12  4:15 AM      Result Value Range   WBC 10.7 (*) 4.0 - 10.5 K/uL   RBC 3.61 (*) 4.22 - 5.81 MIL/uL   Hemoglobin 7.6 (*) 13.0 - 17.0 g/dL   Comment: DELTA CHECK NOTED     POST TRANSFUSION SPECIMEN   HCT 25.3 (*) 39.0 - 52.0 %   MCV 70.1 (*) 78.0 - 100.0 fL   MCH 21.1 (*) 26.0 - 34.0 pg   MCHC 30.0  30.0 - 36.0 g/dL   RDW 63.8 (*) 75.6 - 43.3 %   Platelets 471 (*) 150 - 400 K/uL  BASIC METABOLIC PANEL     Status: Abnormal   Collection Time    08/28/12  4:15 AM      Result Value Range   Sodium 137  135 - 145 mEq/L   Potassium 3.3 (*) 3.5 - 5.1 mEq/L   Chloride 100  96 - 112 mEq/L   CO2  30  19 - 32 mEq/L   Glucose, Bld 106 (*) 70 - 99 mg/dL   BUN 7  6 - 23 mg/dL   Creatinine, Ser 4.09  0.50 - 1.35 mg/dL   Calcium 8.6  8.4 - 81.1 mg/dL   GFR calc non Af Amer >90  >90 mL/min   GFR calc Af Amer >90  >90 mL/min   Comment:            The eGFR has been calculated     using the CKD EPI equation.     This calculation has not been     validated in all clinical     situations.     eGFR's persistently     <90 mL/min signify     possible Chronic Kidney Disease.    Imaging / Studies: No results found.  Medications / Allergies: per chart  Antibiotics: Anti-infectives   Start     Dose/Rate Route Frequency Ordered Stop   08/25/12 1600  cefoTEtan (CEFOTAN) 2 g in dextrose 5 % 50 mL IVPB     2 g 100 mL/hr over 30 Minutes Intravenous Every 12 hours 08/25/12 1502 08/25/12 1653   08/25/12 0830  clindamycin (CLEOCIN) 900 mg, gentamicin (GARAMYCIN) 240 mg in sodium chloride 0.9 % 1,000 mL for intraperitoneal lavage  Status:  Discontinued      Intraperitoneal To Surgery 08/25/12 0815 08/25/12 1448   08/25/12 0815  cefoTEtan (CEFOTAN) 2 g in dextrose 5 % 50 mL IVPB     2 g 100 mL/hr over 30 Minutes Intravenous On call to O.R. 08/25/12 0815 08/25/12 1029

## 2012-08-29 ENCOUNTER — Telehealth (INDEPENDENT_AMBULATORY_CARE_PROVIDER_SITE_OTHER): Payer: Self-pay

## 2012-08-29 DIAGNOSIS — C184 Malignant neoplasm of transverse colon: Secondary | ICD-10-CM

## 2012-08-29 NOTE — Discharge Summary (Signed)
Physician Discharge Summary  Patient ID: Jason Bray MRN: [id number] DOB/AGE: 58-21-1957 58 y.o.  Admit date: 08/25/2012 Discharge date: 08/29/2012  Admission Diagnoses: Principal Problem:   Cancer of transverse colon s/p partial colectomy pT3,pN1a (1/28),pMX Active Problems:   ANXIETY   Adenomatous colon polyp - prox descending colon  Discharge Diagnoses:  Principal Problem:   Cancer of transverse colon s/p partial colectomy pT3,pN1a (1/28),pMX Active Problems:   ANXIETY   Adenomatous colon polyp - prox descending colon   Discharged Condition: good  Hospital Course: Patient with large polyp & cancer in mid colon.  Underwent laparoscopic resection.  ostoperatively, the patient was placed on an anti-ileus protocol.  The patient mobilized and advanced to a solid diet gradually.  Pain was well-controlled and transitioned off IV medications.  Anemia treated with transfusion.  By the time of discharge, the patient was walking well the hallways, eating food well, having flatus.  Pain was-controlled on an oral regimen.  Based on meeting DC criteria and recovering well, I felt it was safe for the patient to be discharged home with close followup.  Instructions were discussed in detail.  They are written as well.Pathology discussed & copy of report given.  He will benefit from MedOnc evaluation.       Consults: None  Significant Diagnostic Studies:   Diagnosis Colon, segmental resection for tumor, proximal - INVASIVE MODERATELY DIFFERENTIATED ADENOCARCINOMA, INVADING THROUGH THE MUSCULARIS PROPRIA INTO PERICOLONIC FATTY TISSUE; NO EVIDENCE OF ANGIOLYMPHATIC INVASION IDENTIFIED. - A LARGE TUBULAR ADENOMA WITH NO EVIDENCE OF HIGH GRADE DYSPLASIA OR MALIGNANCY (1.7 CM). - ONE OF TWENTY-EIGHT LYMPH NODES, POSITIVE FOR METASTATIC CARCINOMA WITH EXTRANODAL EXTENSION (1/28) - RESECTION MARGINS, NEGATIVE FOR MALIGNANCY. Microscopic Comment COLON AND RECTUM Specimen: Proximal  colon Procedure: Segmental resection Tumor site: Transverse colon Specimen integrity: Intact Macroscopic intactness of mesorectum: N/A Macroscopic tumor perforation: No Invasive tumor: Maximum size: 6.0 cm, Blanche Scovell measurement Histologic type(s): Invasive adenocarcinoma Histologic grade and differentiation: G2: moderately differentiated/low grade Type of polyp in which invasive carcinoma arose: N/A Microscopic extension of invasive tumor: Invading through the muscularis propria into pericolonic fatty tissue. Lymph-Vascular invasion: Not identified Peri-neural invasion: Not identified Tumor deposit(s) (discontinuous extramural extension): N/A Resection margins: Negative Proximal margin: 20 cm Distal margin: 21 cm Circumferential (radial) (posterior ascending, posterior descending; lateral 1 of 3 FINAL for SHONTA, PHILLIS ([id number]) Microscopic Comment(continued) and posterior mid-rectum; and entire lower 1/3 rectum): 4.5 cm Treatment effect (neoadjuvant therapy): No Number of lymph nodes examined 28; Number positive 1 Additional polyp(s): A large tubular adenoma (1.7 cm) with no evidence of high grade dysplasia or malignancy Non-neoplastic findings: N/A Pathologic Staging: pT3,pN1a,pMX Ancillary studies: A tumor block will be sent out for MSI testing and an addendum report will follow. (HCL:kh 08/29/18) Abigail Miyamoto MD Pathologist, Electronic Signature (Case signed 08/28/2012)  Treatments: surgery:   POST-OPERATIVE DIAGNOSIS: cancer hepatic flexure and polyp mid transverse colon  PROCEDURE: Procedure(s):  LAPAROSCOPIC PARTIAL COLECTOMY  SURGEON: Surgeon(s):  Ardeth Sportsman, MD  Romie Levee, MD - Asst   Discharge Exam: Blood pressure 150/85, pulse 84, temperature 97.9 F (36.6 C), temperature source Oral, resp. rate 18, height 5\' 7"  (1.702 m), weight 182 lb 1.6 oz (82.6 kg), SpO2 93.00%.  General: Pt awake/alert/oriented x4 in no major acute distress.  Calm,  smiling Eyes: PERRL, normal EOM. Sclera nonicteric Neuro: CN II-XII intact w/o focal sensory/motor deficits. Lymph: No head/neck/groin lymphadenopathy Psych:  No delerium/psychosis/paranoia HENT: Normocephalic, Mucus membranes moist.  No thrush Neck: Supple, No tracheal deviation  Chest: No pain.  Good respiratory excursion. CV:  Pulses intact.  Regular rhythm MS: Normal AROM mjr joints.  No obvious deformity Abdomen: Soft, Nondistended.  Incision closed & Nontender.  No incarcerated hernias. Ext:  SCDs BLE.  No significant edema.  No cyanosis Skin: No petechiae / purpura   Disposition:   Discharge Orders   Future Appointments Provider Department Dept Phone   09/15/2012 10:00 AM Ardeth Sportsman, MD [facility], [place] [phone]   Future Orders Complete By Expires     Call MD for:  extreme fatigue  As directed     Call MD for:  hives  As directed     Call MD for:  persistant nausea and vomiting  As directed     Call MD for:  redness, tenderness, or signs of infection (pain, swelling, redness, odor or green/yellow discharge around incision site)  As directed     Call MD for:  severe uncontrolled pain  As directed     Call MD for:  As directed     Comments:      Temperature > 101.60F    Diet - low sodium heart healthy  As directed     Discharge instructions  As directed     Comments:      Please see discharge instruction sheets.  Also refer to handout given an office.  Please call our office if you have any questions or concerns [phone]    Discharge wound care:  As directed     Comments:      If you have closed incisions, shower and bathe over these incisions with soap and water every day.  Remove all surgical dressings on postoperative day #3.  You do not need to replace dressings over the closed incisions unless you feel more comfortable with a Band-Aid covering it.   If you have an open wound that requires packing, please see wound care instructions.  In general,  remove all dressings, wash wound with soap and water and then replace with saline moistened gauze.  Do the dressing change at least every day.  Please call our office [phone] if you have further questions.    Driving Restrictions  As directed     Comments:      No driving until off narcotics and can safely swerve away without pain during an emergency    Increase activity slowly  As directed     Comments:      Walk an hour a day.  Use 20-30 minute walks.  When you can walk 30 minutes without difficulty, increase to low impact/moderate activities such as biking, jogging, swimming, sexual activity..  Eventually can increase to unrestricted activity when not feeling pain.  If you feel pain: STOP!Marland Kitchen   Let pain protect you from overdoing it.  Use ice/heat/over-the-counter pain medications to help minimize his soreness.  Use pain prescriptions as needed to remain active.  It is better to take extra pain medications and be more active than to stay bedridden to avoid all pain medications.    Lifting restrictions  As directed     Comments:      Avoid heavy lifting initially.  Do not push through pain.  You have no specific weight limit.  Coughing and sneezing or four more stressful to your incision than any lifting you will do. Pain will protect you from injury.  Therefore, avoid intense activity until off all narcotic pain medications.  Coughing and sneezing or four more stressful to  your incision than any lifting he will do.    May shower / Bathe  As directed     May walk up steps  As directed     Sexual Activity Restrictions  As directed     Comments:      Sexual activity as tolerated.  Do not push through pain.  Pain will protect you from injury.    Walk with assistance  As directed     Comments:      Walk over an hour a day.  May use a walker/cane/companion to help with balance and stamina.        Medication List    TAKE these medications       aspirin 325 MG tablet  Take 325 mg by mouth 3  (three) times a week.     fish oil-omega-3 fatty acids 1000 MG capsule  Take 2 g by mouth daily.     MELATONIN TR PO  Take 1 tablet by mouth at bedtime as needed (sleep).     metoprolol succinate 50 MG 24 hr tablet  Commonly known as:  TOPROL-XL  Take 50 mg by mouth at bedtime. Take with or immediately following a meal.     naproxen 500 MG tablet  Commonly known as:  NAPROSYN  Take 1 tablet (500 mg total) by mouth 2 (two) times daily with a meal.     OVER THE COUNTER MEDICATION  Take 1 Package by mouth daily. Vitamin supplement pack     oxyCODONE 5 MG immediate release tablet  Commonly known as:  Oxy IR/ROXICODONE  Take 1-2 tablets (5-10 mg total) by mouth every 4 (four) hours as needed for pain.     simvastatin 20 MG tablet  Commonly known as:  ZOCOR  Take 20 mg by mouth at bedtime.           Follow-up Information   Follow up with Landen Knoedler C., MD. Schedule an appointment as soon as possible for a visit in 2 weeks.   Contact information:   [address] [place] [address] [phone]       Signed: Ardeth Sportsman. 08/29/2012, 8:05 AM

## 2012-08-29 NOTE — Telephone Encounter (Signed)
LMOM for pt to call me back. I want to let the pt know that I am referring pt to medical oncology [facility] Dr Gordy Savers request.

## 2012-08-30 ENCOUNTER — Telehealth: Payer: Self-pay | Admitting: *Deleted

## 2012-08-30 NOTE — Telephone Encounter (Signed)
Spoke with patient and confirmed appointment with Dr. Truett Perna for 09/14/12.  Explained role of navigator and provided contact names and numbers to patient.

## 2012-09-01 ENCOUNTER — Encounter (INDEPENDENT_AMBULATORY_CARE_PROVIDER_SITE_OTHER): Payer: Self-pay

## 2012-09-06 ENCOUNTER — Telehealth (INDEPENDENT_AMBULATORY_CARE_PROVIDER_SITE_OTHER): Payer: Self-pay

## 2012-09-06 NOTE — Telephone Encounter (Signed)
Pt returning call to [facility]. Pt advised I will send current phone # to her so she can return his call re: rtw. Pt can be reached at [phone].

## 2012-09-06 NOTE — Telephone Encounter (Signed)
LMOM for pt to call me back so we can discuss RTW date.

## 2012-09-07 ENCOUNTER — Telehealth (INDEPENDENT_AMBULATORY_CARE_PROVIDER_SITE_OTHER): Payer: Self-pay

## 2012-09-07 NOTE — Telephone Encounter (Signed)
Called pt back to discuss his RTW date. The pt has a f/u appt with Dr Michaell Cowing on 09/19/12 so I advised pt to not go back to work till after his appt with Dr Michaell Cowing. I advised pt that we could get him a RTW note the day he is in for his appt to go back the day after his appt with Dr Michaell Cowing. The pt understands.

## 2012-09-08 ENCOUNTER — Encounter (INDEPENDENT_AMBULATORY_CARE_PROVIDER_SITE_OTHER): Payer: Self-pay

## 2012-09-14 ENCOUNTER — Other Ambulatory Visit: Payer: Self-pay | Admitting: *Deleted

## 2012-09-14 ENCOUNTER — Ambulatory Visit (HOSPITAL_BASED_OUTPATIENT_CLINIC_OR_DEPARTMENT_OTHER): Payer: BC Managed Care – PPO | Admitting: Oncology

## 2012-09-14 ENCOUNTER — Encounter: Payer: Self-pay | Admitting: *Deleted

## 2012-09-14 ENCOUNTER — Encounter: Payer: Self-pay | Admitting: Oncology

## 2012-09-14 ENCOUNTER — Ambulatory Visit: Payer: BC Managed Care – PPO

## 2012-09-14 ENCOUNTER — Telehealth: Payer: Self-pay | Admitting: Oncology

## 2012-09-14 ENCOUNTER — Telehealth (INDEPENDENT_AMBULATORY_CARE_PROVIDER_SITE_OTHER): Payer: Self-pay

## 2012-09-14 VITALS — BP 142/74 | HR 90 | Temp 97.0°F | Resp 20 | Ht 67.0 in | Wt 163.2 lb

## 2012-09-14 DIAGNOSIS — D509 Iron deficiency anemia, unspecified: Secondary | ICD-10-CM

## 2012-09-14 DIAGNOSIS — C184 Malignant neoplasm of transverse colon: Secondary | ICD-10-CM

## 2012-09-14 DIAGNOSIS — F411 Generalized anxiety disorder: Secondary | ICD-10-CM

## 2012-09-14 DIAGNOSIS — R000 Tachycardia, unspecified: Secondary | ICD-10-CM

## 2012-09-14 NOTE — Telephone Encounter (Incomplete)
Talked to Huntley Dec RN , Triage, portacath will be added to pt appt on 09/19/12 when pt see Dr. Michaell Cowing, pt will have chemo class same day as nutrition class , gave pt appt for MD and labs, emailed [facility] regarding chemo

## 2012-09-14 NOTE — Progress Notes (Signed)
[facility] New Patient Consult   Referring MD: Shray Hunley 57 y.o.  02-Sep-1955    Reason for Referral: Colon cancer     HPI: He underwent a screening colonoscopy by Dr. Bosie Clos on 08/07/2012. A large mass was found in the proximal transverse colon. The mass was circumferential and oozing was present. The mass was biopsied. A pedunculated polyp was found in the descending colon at 70 cm proximal to the anus. The polyp was biopsied and tattooed. The pathology from the proximal transverse colon mass revealed adenocarcinoma. The biopsy from the polyp at the descending colon revealed fragments of a tubular villous adenoma. High-grade dysplasia was not identified.  CTs of the chest, abdomen, and pelvis on 08/15/2012 revealed a normal CT of the chest with no evidence of metastatic disease. An annular carcinoma was noted at the hepatic flexure with a single slightly prominent mesenteric lymph node adjacent to the tumor. A few "tiny "adjacent mesenteric lymph nodes were noted. No evidence of metastatic disease to the liver.  He was referred to Dr. Michaell Cowing and was taken to the operating room on 08/25/2012 for a laparoscopic partial colectomy. A large mass was noted at the hepatic flexure and a decrease in your polyp was seen in the mid transverse colon. No evidence of metastatic disease. A side-to-side anastomosis of the ileum and distal transverse colon was created.  The pathology [id number]) confirmed an invasive moderately differentiated adenocarcinoma invading through the muscularis propria into the pericolonic fatty tissue. No evidence of angiolymphatic or perineural invasion. A large tubular adenoma with no evidence of high-grade dysplasia or malignancy was also identified. One of 20 lymph nodes was positive for metastatic carcinoma with extranodal extension. The resection margins were negative. No macroscopic tumor perforation. The tumor return microsatellite  stable and there was no loss of expression of mismatch repair proteins.  He received a red cell transfusion following surgery. The preoperative hemoglobin returned at 10.0 with an MCV of 67.3 on 08/21/2012. On 08/27/2012 the hemoglobin was down to 6.1. He received a red cell transfusion on 08/27/2012.  He reports feeling well prior to surgery.  Past Medical History  Diagnosis Date  . Hyperlipidemia   . Anginal pain 3 years ago    one time occurance  . Dysrhythmia     tachycardia  . Anxiety   . GERD (gastroesophageal reflux disease)   . Cancer-ascending colon-stage III, T3 N1 08/25/2012    cancerous colon polyp  .      Past Surgical History  Procedure Laterality Date  . Cardiac catheterization  06/2009    non cardiac  . Colonoscopy 08/07/2012   . Laparoscopic partial colectomy N/A 08/25/2012    Procedure: LAPAROSCOPIC PARTIAL COLECTOMY  ;  Surgeon: Ardeth Sportsman, MD;  Location: [facility];  Service: General;  Laterality: N/A;    Family History  Problem Relation Age of Onset  . Lymphoma Mother 39  . Hypertension Father   . Hyperlipidemia Father   . Hypertension Brother     Current outpatient prescriptions:metoprolol succinate (TOPROL-XL) 50 MG 24 hr tablet, Take 50 mg by mouth at bedtime. Take with or immediately following a meal., Disp: , Rfl: ;  Multiple Vitamins-Iron (MULTIVITAMIN/IRON PO), Take 1 tablet by mouth daily., Disp: , Rfl: ;  naproxen (NAPROSYN) 500 MG tablet, Take 1 tablet (500 mg total) by mouth 2 (two) times daily with a meal., Disp: 40 tablet, Rfl: 2 oxyCODONE (OXY IR/ROXICODONE) 5 MG immediate release tablet, Take 1-2  tablets (5-10 mg total) by mouth every 4 (four) hours as needed for pain., Disp: 50 tablet, Rfl: 0;  simvastatin (ZOCOR) 20 MG tablet, Take 20 mg by mouth at bedtime., Disp: , Rfl: ;  aspirin 325 MG tablet, Take 325 mg by mouth 3 (three) times a week. , Disp: , Rfl: ;  fish oil-omega-3 fatty acids 1000 MG capsule, Take 2 g by mouth daily. , Disp: ,  Rfl:  OVER THE COUNTER MEDICATION, Take 1 Package by mouth daily. Vitamin supplement pack---Mega Men Supplement, Disp: , Rfl:   Allergies: No Known Allergies  Social History: he works in an office occupation. He quit smoking cigarettes 6 years ago. He drinks alcohol occasionally. No transfusion history prior to the hospital admission for the colectomy. No risk factor for HIV or hepatitis.   ROS:   Positives include:none  A complete ROS was otherwise negative.  Physical Exam:  Blood pressure 142/74, pulse 90, temperature 97 F (36.1 C), temperature source Oral, resp. rate 20, height 5\' 7"  (1.702 m), weight 163 lb 3.2 oz (74.027 kg), SpO2 100.00%.  HEENT: oropharynx without  Visible mass, neck without mass Lungs:scattered end inspiratory rhonchi, no respiratory distress, good air movement bilaterally Cardiac: regular rate and rhythm Abdomen: healed surgical incisions, no hepatomegaly, no mass GU: testes without mass  Vascular: no leg edema Lymph nodes: no cervical, supraclavicular, axillary, or inguinal nodes Neurologic: alert and oriented, the motor exam appears intact in the upper and lower extremities Skin: no rash Musculoskeletal: no back tenderness   LAB:  CBC  Lab Results  Component Value Date   WBC 10.7* 08/28/2012   HGB 7.6* 08/28/2012   HCT 25.3* 08/28/2012   MCV 70.1* 08/28/2012   PLT 471* 08/28/2012     CMP      Component Value Date/Time   NA 137 08/28/2012 0415   K 3.3* 08/28/2012 0415   CL 100 08/28/2012 0415   CO2 30 08/28/2012 0415   GLUCOSE 106* 08/28/2012 0415   BUN 7 08/28/2012 0415   CREATININE 0.71 08/28/2012 0415   CREATININE 0.88 08/11/2012 1603   CALCIUM 8.6 08/28/2012 0415   PROT 7.7 06/17/2009 1358   ALBUMIN 4.1 06/17/2009 1358   AST 24 06/17/2009 1358   ALT 23 06/17/2009 1358   ALKPHOS 63 06/17/2009 1358   BILITOT 1.1 06/17/2009 1358   GFRNONAA >90 08/28/2012 0415   GFRAA >90 08/28/2012 0415   CEA on 08/11/2012-1.5  Radiology:as per history of present  illness    Assessment/Plan:    1. Stage III (T3 N1) adenocarcinoma of the ascending colon, status post a partial colectomy on 08/25/2012. The tumor is microsatellite stable and there was no loss of mismatch repair protein expression.  2. Transverse colon polyp, removed in the colectomy specimen 08/25/2012  3. Iron deficiency anemia   Disposition:   Mr. Schneller has been diagnosed with stage III colon cancer. I discussed the diagnosis, prognosis, and adjuvant treatment options with Mr. Myra Rude and his wife. We reviewed the details of the surgical pathology report. There is a significant chance of developing recurrent colon cancer over the next several years. I estimate the relapse rate in the absence of adjuvant therapy to be in the 30-40% range.  We discussed the benefit associated with adjuvant 5-fluorouracil based therapy. We reviewed the predicted decrease in the relapse rate with the addition of oxaliplatin.  I recommend adjuvant 5-fluorouracil and oxaliplatin-based systemic therapy. We discussed CAPOX, FOLFOX, and the CALGB 69629 clinical trial. He met with a research  nurse today. He may not be a candidate for the clinical trial secondary to his current aspirin use. This will be clarified with the protocol chair over the next few days.  If he does not enroll on the CALGB study he favors treatment with CAPOX. We discussed potential toxicities associated with the CAPOX regimen including the chance for nausea/vomiting, mucositis, diarrhea, and hematologic toxicity. We reviewed the skin rash, hyperpigmentation, and hand/foot syndrome associated with capecitabine. We discussed the various types of neuropathy seen with oxaliplatin.  He will be scheduled for a chemotherapy teaching class. We will refer him to Dr. Michaell Cowing for placement of a Port-A-Cath.   We discussed  diet and exercise recommendations for decreasing the risk of recurrent colon cancer  He is scheduled for a first cycle of CAPOX  to begin on 09/29/2012. Mr. Ungaro will return for an office visit on 10/19/2012. He will begin ferrous sulfate for treatment of the iron deficiency anemia.  Approximately 60 minutes were spent with the patient and his wife today. The majority of the time was spent in counseling/coordination of care.  Lori-Ann Lindfors 09/14/2012, 6:13 PM

## 2012-09-14 NOTE — Progress Notes (Signed)
Checked in new pt with no financial concerns. °

## 2012-09-14 NOTE — Progress Notes (Signed)
Met with patient and family. Explained role of nurse navigator. Educational information provided on colon cancer and porta cath placement along with [facility] resource sheet and phone numbers. Referral made to dietician for diet education. [facility] resources provided to patient, including SW service information.  Contact names and phone numbers were provided.  No barriers to care identified.  Will continue to follow as needed.

## 2012-09-14 NOTE — Telephone Encounter (Signed)
Rose called to report the patient starts chemo on 6/20.  He has a follow up on 6/10 here with Dr Michaell Cowing.  He will need a portacath inserted per Dr Truett Perna.

## 2012-09-15 ENCOUNTER — Encounter (INDEPENDENT_AMBULATORY_CARE_PROVIDER_SITE_OTHER): Payer: BC Managed Care – PPO | Admitting: Surgery

## 2012-09-15 ENCOUNTER — Telehealth: Payer: Self-pay | Admitting: *Deleted

## 2012-09-15 NOTE — Telephone Encounter (Signed)
Per staff phone call and POF I have schedueld appts.  JMW  

## 2012-09-15 NOTE — Telephone Encounter (Signed)
Orders in EPIC Posting sheet by Staff Message

## 2012-09-18 ENCOUNTER — Encounter (HOSPITAL_COMMUNITY): Payer: Self-pay | Admitting: Pharmacy Technician

## 2012-09-18 ENCOUNTER — Encounter (HOSPITAL_COMMUNITY): Payer: Self-pay

## 2012-09-18 ENCOUNTER — Encounter (HOSPITAL_COMMUNITY)
Admission: RE | Admit: 2012-09-18 | Discharge: 2012-09-18 | Disposition: A | Discharge status: Home / Self Care | Payer: BC Managed Care – PPO | Source: Ambulatory Visit | Attending: Surgery | Admitting: Surgery

## 2012-09-18 HISTORY — DX: Anemia, unspecified: D64.9

## 2012-09-18 LAB — CBC
HCT: 33.7 % — ABNORMAL LOW (ref 39.0–52.0)
Hemoglobin: 10 g/dL — ABNORMAL LOW (ref 13.0–17.0)
MCH: 21.1 pg — ABNORMAL LOW (ref 26.0–34.0)
MCHC: 29.7 g/dL — ABNORMAL LOW (ref 30.0–36.0)
MCV: 70.9 fL — ABNORMAL LOW (ref 78.0–100.0)
Platelets: 1094 K/uL (ref 150–400)
RBC: 4.75 MIL/uL (ref 4.22–5.81)
RDW: 20.8 % — ABNORMAL HIGH (ref 11.5–15.5)
WBC: 8.9 K/uL (ref 4.0–10.5)

## 2012-09-18 LAB — BASIC METABOLIC PANEL
CO2: 29 mEq/L (ref 19–32)
Calcium: 9.9 mg/dL (ref 8.4–10.5)
Chloride: 102 mEq/L (ref 96–112)
Creatinine, Ser: 0.76 mg/dL (ref 0.50–1.35)
GFR calc Af Amer: >90 mL/min (ref >90)
Sodium: 140 mEq/L (ref 135–145)

## 2012-09-18 LAB — SURGICAL PCR SCREEN
MRSA, PCR: INVALID — AB
Staphylococcus aureus: INVALID — AB

## 2012-09-18 NOTE — Progress Notes (Signed)
Critical vlalue of platelet count received of platelets 1094 at 1542pm.  Office of [facility] called and Dr Michaell Cowing paged by Belenda Cruise at office.  Dr Michaell Cowing returned page at 1550pm.  Order received by Dr Michaell Cowing to repeat CBC on day of surgery .  Informed Dr Michaell Cowing at time of preop appointment patient looked unremarkable.  CBC for day of surgery placed in EPIC.

## 2012-09-18 NOTE — Patient Instructions (Addendum)
SHAUNAK KREIS  09/18/2012   Your procedure is scheduled on:  09/22/12   Report to [facility] at   1030 AM.  Call this number if you have problems the morning of surgery: [phone]   Remember:   Do not eat food or drink liquids after midnight.   Take these medicines the morning of surgery with A SIP OF WATER:    Do not wear jewelry,   Do not wear lotions, powders, or perfumes.   . Men may shave face and neck.  Do not bring valuables to the hospital.  Contacts, dentures or bridgework may not be worn into surgery.       Patients discharged the day of surgery will not be allowed to drive  home.  Name and phone number of your driver:    SEE [facility] INSTRUCTION SHEET    Please read over the following fact sheets that you were given: MRSA Information, coughing and deep breathing exercises, leg exercises               Failure to comply with these instructions may result in cancellation of your surgery.                Patient Signature ____________________________              Nurse Signature _____________________________

## 2012-09-19 ENCOUNTER — Ambulatory Visit (INDEPENDENT_AMBULATORY_CARE_PROVIDER_SITE_OTHER): Payer: BC Managed Care – PPO | Admitting: Surgery

## 2012-09-19 ENCOUNTER — Encounter (INDEPENDENT_AMBULATORY_CARE_PROVIDER_SITE_OTHER): Payer: Self-pay | Admitting: Surgery

## 2012-09-19 VITALS — BP 160/94 | HR 68 | Temp 97.5°F | Resp 18 | Ht 67.5 in | Wt 165.8 lb

## 2012-09-19 DIAGNOSIS — D126 Benign neoplasm of colon, unspecified: Secondary | ICD-10-CM

## 2012-09-19 DIAGNOSIS — C184 Malignant neoplasm of transverse colon: Secondary | ICD-10-CM

## 2012-09-19 LAB — PATHOLOGIST SMEAR REVIEW

## 2012-09-19 NOTE — Progress Notes (Signed)
Subjective:     Patient ID: Jason Bray, male   DOB: 08/06/1955, 57 y.o.   MRN: [id number]  HPI  Leonardo J Dolney  11/14/1955 [id number]  Patient Care Team: Robert L Fried, MD as PCP - General (Family Medicine) Vincent C. Schooler, MD as Consulting Physician (Gastroenterology) Brad Lieurance C. Naheem Mosco, MD as Consulting Physician (General Surgery) Peter C Nishan, MD as Consulting Physician (Cardiology)  This patient is a 57 y.o.male who presents today for surgical evaluation s/p surgery 08/25/2012  POST-OPERATIVE DIAGNOSIS: cancer hepatic flexure and polyp mid transverse colon  PROCEDURE: Procedure(s):  LAPAROSCOPIC PARTIAL COLECTOMY  SURGEON: Surgeon(s):  Kreg Earhart C. Kaydon Creedon, MD  Alicia Thomas, MD - Asst   Patient comes today feeling well.  They will.  Daily bowel movements.  No fevers or chills.  Energy level getting better.  Wants to get back to golfing.  He is seen He see medical oncology.  He is felt to benefit for chemotherapy.  Request made for placement of portacatheter.  Patient Active Problem List   Diagnosis Date Noted  . 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 Adenomatous colon polyp - prox descending colon 08/09/2012  . HYPERLIPIDEMIA 07/09/2009  . ANXIETY 07/09/2009  . CHEST PAIN 07/09/2009    Past Medical History  Diagnosis Date  . Hyperlipidemia   . Anxiety   . GERD (gastroesophageal reflux disease)   . Cancer     cancerous colon polyp  . Anemia     Past Surgical History  Procedure Laterality Date  . Cardiac catheterization  06/2009    non cardiac  . Colonoscopy    . Laparoscopic partial colectomy N/A 08/25/2012    Procedure: LAPAROSCOPIC PARTIAL COLECTOMY  ;  Surgeon: Keionna Kinnaird C. Zerrick Hanssen, MD;  Location: [facility];  Service: General;  Laterality: N/A;    History   Social History  . Marital Status: Married    Spouse Name: Elizabeth    Number of Children: 3  . Years of Education: N/A   Occupational History  . project  manager    Social History Main Topics  . Smoking status: Former Smoker -- 1.00 packs/day for 20 years    Types: Cigarettes    Quit date: 01/19/2007  . Smokeless tobacco: Former User    Types: Chew    Quit date: 01/18/1998  . Alcohol Use: 0.0 oz/week     Comment: weekly   . Drug Use: No  . Sexually Active: Not on file   Other Topics Concern  . 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 Lymphoma Mother   . Hypertension Father   . Hyperlipidemia Father   . Hypertension Brother     Current Outpatient Prescriptions  Medication Sig Dispense Refill  . ferrous sulfate 325 (65 FE) MG tablet Take 325 mg by mouth daily with breakfast.      . metoprolol succinate (TOPROL-XL) 50 MG 24 hr tablet Take 50 mg by mouth at bedtime. Take with or immediately following a meal.      . naproxen (NAPROSYN) 500 MG tablet Take 1 tablet (500 mg total) by mouth 2 (two) times daily with a meal.  40 tablet  2  . oxyCODONE (OXY IR/ROXICODONE) 5 MG immediate release tablet Take 1-2 tablets (5-10   mg total) by mouth every 4 (four) hours as needed for pain.  50 tablet  0  . simvastatin (ZOCOR) 20 MG tablet Take 20 mg by mouth at bedtime.      . aspirin 325 MG tablet Take 325 mg by mouth 3 (three) times a week.       . Multiple Vitamins-Iron (MULTIVITAMIN/IRON PO) Take 1 tablet by mouth daily.      . OVER THE COUNTER MEDICATION Take 1 Package by mouth daily. Vitamin supplement pack---Mega Men Supplement       No current facility-administered medications for this visit.     No Known Allergies  BP 160/94  Pulse 68  Temp(Src) 97.5 F (36.4 C) (Temporal)  Resp 18  Ht 5' 7.5" (1.715 m)  Wt 165 lb 12.8 oz (75.206 kg)  BMI 25.57 kg/m2  No results found.   Review of Systems  Constitutional: Negative for fever, chills and  diaphoresis.  HENT: Negative for sore throat, trouble swallowing and neck pain.   Eyes: Negative for photophobia and visual disturbance.  Respiratory: Negative for choking and shortness of breath.   Cardiovascular: Negative for chest pain and palpitations.  Gastrointestinal: Negative for nausea, vomiting, abdominal distention, anal bleeding and rectal pain.  Genitourinary: Negative for dysuria, urgency, difficulty urinating and testicular pain.  Musculoskeletal: Negative for myalgias, arthralgias and gait problem.  Skin: Negative for color change and rash.  Neurological: Negative for dizziness, speech difficulty, weakness and numbness.  Hematological: Negative for adenopathy.  Psychiatric/Behavioral: Negative for hallucinations, confusion and agitation.       Objective:   Physical Exam  Constitutional: He is oriented to person, place, and time. He appears well-developed and well-nourished. No distress.  HENT:  Head: Normocephalic.  Mouth/Throat: Oropharynx is clear and moist. No oropharyngeal exudate.  Eyes: Conjunctivae and EOM are normal. Pupils are equal, round, and reactive to light. No scleral icterus.  Neck: Normal range of motion. No tracheal deviation present.  Cardiovascular: Normal rate, normal heart sounds and intact distal pulses.   Pulmonary/Chest: Effort normal. No respiratory distress.  Abdominal: Soft. He exhibits no distension. There is no tenderness. Hernia confirmed negative in the right inguinal area and confirmed negative in the left inguinal area.  Incisions clean with normal healing ridges.  No hernias  Musculoskeletal: Normal range of motion. He exhibits no tenderness.  Neurological: He is alert and oriented to person, place, and time. No cranial nerve deficit. He exhibits normal muscle tone. Coordination normal.  Skin: Skin is warm and dry. No rash noted. He is not diaphoretic.  Psychiatric: He has a normal mood and affect. His behavior is normal.   ADDITIONAL  INFORMATION: The tumor block was sent to [facility] for MSI testing. Microsatellite instability PCR: microsatellite stable. (BAT-26, NR-21, BAT-25, MONO-27 and NR-24: stable; accession number: [id number]) H. CATHERINE LI MD Pathologist, Electronic Signature ( Signed 09/07/2012) The tumor block was sent to [facility] for MSI testing: IHC report: no loss of expression of MSH-6, PMS-2, MLH-1 and MSH-2 ([id number], [facility] reference number). H. CATHERINE LI MD Pathologist, Electronic Signature ( Signed 09/06/2012) FINAL DIAGNOSIS Diagnosis Colon, segmental resection for tumor, proximal - INVASIVE MODERATELY DIFFERENTIATED ADENOCARCINOMA, INVADING THROUGH THE MUSCULARIS PROPRIA INTO PERICOLONIC FATTY TISSUE; NO EVIDENCE OF ANGIOLYMPHATIC INVASION IDENTIFIED. - A LARGE TUBULAR ADENOMA WITH NO EVIDENCE OF HIGH GRADE DYSPLASIA OR MALIGNANCY (1.7 CM). - ONE OF TWENTY-EIGHT LYMPH NODES, POSITIVE FOR METASTATIC CARCINOMA WITH EXTRANODAL EXTENSION (1/28) - RESECTION MARGINS, NEGATIVE FOR MALIGNANCY. 1 of 3 FINAL for Diffley, Aadhav J ([id number])   Microscopic Comment COLON AND RECTUM Specimen: Proximal colon Procedure: Segmental resection Tumor site: Transverse colon Specimen integrity: Intact Macroscopic intactness of mesorectum: N/A Macroscopic tumor perforation: No Invasive tumor: Maximum size: 6.0 cm, [place] measurement Histologic type(s): Invasive adenocarcinoma Histologic grade and differentiation: G2: moderately differentiated/low grade Type of polyp in which invasive carcinoma arose: N/A Microscopic extension of invasive tumor: Invading through the muscularis propria into pericolonic fatty tissue. Lymph-Vascular invasion: Not identified Peri-neural invasion: Not identified Tumor deposit(s) (discontinuous extramural extension): N/A Resection margins: Negative Proximal margin: 20 cm Distal margin: 21 cm Circumferential (radial) (posterior ascending, posterior descending; lateral and  posterior mid-rectum; and entire lower 1/3 rectum): 4.5 cm Treatment effect (neoadjuvant therapy): No Number of lymph nodes examined 28; Number positive 1 Additional polyp(s): A large tubular adenoma (1.7 cm) with no evidence of high grade dysplasia or malignancy Non-neoplastic findings: N/A Pathologic Staging: pT3,pN1a,pMX Ancillary studies: A tumor block will be sent out for MSI testing and an addendum report will follow. (HCL:kh 08/29/18) H. CATHERINE LI MD Pathologist, Electronic Signature (Case signed 08/28/2012) Specimen Tashira Torre and Clinical Information Specimen(s) Obtained: Colon, segmental resection for tumor, proximal Specimen Clinical Information cancer hepatic flexor polyp mid transverse colon (kp) Kenneth Lax Specimen: Proximal colon, received in formalin Specimen integrity: Intact Specimen length: The specimen consists of 10 cm of terminal ileum and 35 cm of colon Tumor location: The tumor is located in the ascending colon and is circumferential. Tumor size: The tumor consists of a 6 x 3.8 cm sessile, ulcerated mass which has a maximum thickness of 1 cm. Percent of bowel circumference involved: 100% Tumor distance to margins: Proximal: 20 cm Distal: 21 cm Radial (posterior ascending, posterior descending; lateral and posterior mid-rectum; and entire lower 1/3 rectum): 4.5 cm 2 of 3 FINAL for Lariviere, Dangelo J ([id number]) Adayah Arocho(continued) Macroscopic extent of tumor invasion: Tumor invades through the muscularis propria into the subserosal adipose tissue but does not involve the serosa. Total presumed lymph nodes: There are 26 rubbery tan red ovoid nodules tentatively identified as lymph nodes varying in size from 0.3 to 0.8 cm in greatest dimensions. Extramural satellite tumor nodules: Not grossly identified. Mucosal polyp(s): 5 cm from the distal margin there is a 1.7 x 1.5 x 1 cm sessile, rubbery mucosal polyp. Additional findings: The uninvolved mucosa is glistening and  tan. The appendix is present and measures 4.2 cm in length x 0.6 cm in diameter. Block summary: Fifteen blocks submitted A = proximal margin B = distal margin C = proximal tumor to uninvolved mucosa transition D = distal tumor to uninvolved mucosa transition E = deep tumor extension F = tumor G = tissue for molecular testing H, I = mucosal polyp J = appendix K-O = lymph nodes (GRP:kh 08-28-12) Report signed out from the following location(s) [facility] [address],[place] [address].CLIA:[id number],CAP:[id number]., [facility] [address], [place], [place] [address]. CLIA #: [id number], 3 of    Assessment:     Recovering well status post resection for node-positive cancer Of hepatic flexure, recovering well 3 weeks post-op    Plan:     Increase activity as tolerated to regular activity.  Low impact exercise such as walking an hour a day at least ideal.  Do not push through pain.  Diet as tolerated.  Low fat high fiber diet ideal.  Bowel regimen with 30 g fiber a day and fiber supplement as needed to avoid problems.  Return to clinic as needed.   Instructions discussed.  Followup with primary care   physician for other health issues as would normally be done.  Questions answered.  The patient expressed understanding and appreciation  Placement porta catheter later this week.  I discussed the procedure with him:  Use of a central venous catheter for intravenous therapy was discussed.  Technique of catheter placement using ultrasound and fluoroscopy guidance was discussed.  Risks such as bleeding, infection, pneumothorax, catheter occlusion, reoperation, and other risks were discussed.   I noted a good likelihood this will help address the problem.  Questions were answered.  The patient expressed understanding & wishes to proceed.          

## 2012-09-19 NOTE — Progress Notes (Signed)
Left patient a message on cell phone- [phone] that PCR screen had been sent out.  Results not back until 09/21/12.

## 2012-09-19 NOTE — Patient Instructions (Addendum)
Central Lines A central line is a soft, flexible tube (catheter) that is used to give medicine or nutrition through a person's veins. The tip of the central line ends in a large vein (superior vena cava) just above the person's heart. Medicine given through the central line is quickly mixed with blood because the blood flow within this large vein is so great. This dilutes the medicine so it is swiftly delivered throughout the body. Insertion of any type of central line is a sterile procedure.  A central line may be placed because:   You need medicine that would be irritating to the small veins in your hands or arms.   You need long-term IV medicines, such as antibiotics.   You need nutrition delivered through a central line.   You have veins in your hands or arms that are difficult to access.   You need frequent blood draws for lab tests.   You need dialysis. TYPES OF CENTRAL LINES There are different types of central lines. The type of central line you receive depends on how long it will be needed, your medical condition, and the condition of your veins. You might receive any of these types:   Peripherally inserted central catheter (PICC) lines. These are usually inserted in the upper arm. They are used for intermediate to long-term access. These lines are often inserted by a specially trained nurse using ultrasonography to guide the placement.  Non-tunneled central lines. These are inserted in the neck, chest, or groin. They are used for short-term access, usually a maximum of 7 days. These lines are often used in the emergency department, and they can have a high infection rate.  Tunneled central lines. These are normally inserted in the upper chest area. They are used for long-term therapy and may be used for dialysis. These lines are inserted and removed surgically.  Implanted ports. These are normally inserted in the upper chest but can also be placed in the upper arm or in the  stomach area (abdomen). They are used for long-term therapy. Ports are inserted and removed surgically. The person's skin heals over the port insertion site. The port must be accessed using a special needle. RISKS AND COMPLICATIONS Any type of central line has risks that you should be aware of. Some of these include:   Infection.   Clotting of the central line.   Bleeding from the insertion site of the central line.   Developing a hole or crack within the central line. If this happens, the central line will need to be replaced.   Developing an abnormal heart rhythm (rare). The heart may beat rapidly or "skip" beats.   A collapsed lung during insertion (rare). HOME CARE INSTRUCTIONS   Follow your caregiver's specific instructions for the type of device that you have.  Keep any bandages (dressings) over the central lineclean and dry. Wash your hands before a dressing change, and change dressings as directed by your caregiver. Look for redness or irritation at the insertion site during a dressing change.  Keep the insertion site of your central line clean at all times.   Wash your hands and flush your line as directed to keep it open.   If the central line accidentally gets pulled on, make sure that the dressing is okay, that there is no bleeding, and that the line has not been pulled out.   Limit lifting, using your arm, or performing other activity as directed by your caregiver.   Swim or bathe  only if your caregiver approves. Your caregiver can instruct you on how to keep your specific type of dressing from getting wet. SEEK IMMEDIATE MEDICAL CARE IF:   You have chills.  You have a fever.   You have shortness of breath or difficulty breathing.   You have chest pain.   You feel your heart beating rapidly or "skipping" beats.   You feel dizzy or faint.  You have bleeding from the insertion site that does not stop.   You have pain, redness, swelling, or  drainage at the insertion site of the central line.   You have swelling in your neck, face, chest, or arm on the side of your central line.   Your central line is difficult to flush or will not flush.  You do not get a blood return from the central line.  You have a hole or tear in the catheter.   Your catheter leaks when flushed or when fluids are infused into it. MAKE SURE YOU:   Understand these instructions.  Will watch your condition.  Will get help right away if you are not doing well or get worse. Document Released: 05/20/2005 Document Revised: 03/15/2012 Document Reviewed: 01/06/2012 [facility] Patient Information 2014 [place], [place].   Tunneled Central Venous Catheter Flushing Guide A tunneled central venous catheter should be flushed after medicines, intravenous fluids, or nutrition have been given. Flushing the catheter helps prevent blood and medicine residue from collecting in the catheter and clogging it. The following are instructions to flush your tunneled central venous catheter. Your caregiver may also give you additional flushing instructions.  GENERAL TUNNELED CENTRAL VENOUS CATHETER FLUSHING INFORMATION  If possible, another person should help you when flushing the catheter. Always wash your hands before touching or flushing the catheter.  Each access (lumen, port) of the catheter has a clamp. Open the clamp to flush the catheter or when infusions are being given. Keep the clamp closed when the catheter is not in use.  Do not use force to flush the catheter.  Do not use a small diameter syringe such as a 3 mL syringe to flush the catheter. Flushing the catheter with a small diameter syringe can burst the catheter. A 10 mL syringe should be used when flushing the catheter.  Do not use a sharp-edged clamp (hemostat) to clamp the catheter.  If the catheter has heparin instilled in the line, the heparin should be removed before using the catheter. The heparin  helps the catheter from becoming clogged when it is not used on a regular basis. WITHDRAWING HEPARIN FROM THE TUNNELED CENTRAL VENOUS CATHETER  When the tunneled central venous catheter is not used for long periods of time, heparin may be injected into the catheter lumen(s) and left to "dwell" until it is used again.  If heparin has been instilled in the catheter, the heparin must be removed before it can be used. The following are steps to remove heparin from the tunneled central venous catheter:  Gather the appropriate supplies as told by your caregiver.  Wash your hands thoroughly with soap and water.  Put on clean gloves.  Vigorously scrub the injection cap for 15 seconds with an alcohol prep pad.  Unclamp the catheter.  Attach an empty 10 mL syringe to the injection cap. Pull back on the syringe plunger and withdraw 10 milliliters of blood. This is considered a "waste" withdrawal. Dispose the "waste" syringe into an appropriate waste container.  Rescrub the injection cap for 15 seconds with an  alcohol prep pad.  Attach a prefilled 10 mL normal saline syringe to the injection cap. Flush the catheter with normal saline to clear away the blood in the catheter. The catheter can now be used for medicines, intravenous fluids, and nutrition.  Follow your caregiver's advice regarding when the tunneled central venous catheter should be flushed with heparin. FLUSHING THE TUNNELED CENTRAL VENOUS CATHETER WITH NORMAL SALINE   Normal saline is used to flush the catheter before and after medicines, intravenous fluids, and nutrition are given. It is important to check for a blood return before using the catheter. A blood return is checked to ensure the catheter is open (patent) and is not clogged. The following are instructions to check for a blood return and flush the catheter with normal saline.  Gather the appropriate supplies to flush the catheter as instructed by your caregiver.  Wash your  hands thoroughly with soap and water.  Put on clean gloves.  Vigorously scrub the injection cap for 15 seconds with an alcohol prep pad.  Unclamp the catheter.  Attach a 10 mL prefilled normal saline syringe to the injection cap. Flush 5 milliliters of normal saline into the catheter. Pull back on the attached syringe until you see blood in the catheter. When blood is identified in the catheter, flush the catheter with the remaining normal saline.  The catheter can now be used for medicines, intravenous fluids, and nutrition.  After medicines, intravenous fluids, or nutrition have been given, be sure to flush the catheter with 10 milliliters of normal saline. TROUBLESHOOTING THE TUNNELED CENTRAL VENOUS CATHETER  If you cannot flush the catheter, check to make sure the catheter is unclamped.  The injection cap may need to be changed if blood cannot be completely cleared from the cap or if there is a crack in the injection cap.  If the catheter is difficult to flush, flush the catheter with 10 milliliters of normal saline using a "pulsing" flush method.  If you do not see blood when you pull back the syringe plunger, change your body position. This can include raising your arms above your head or taking a deep breath and cough. Pull back on the plunger again. If there is still no blood return, flush with a small amount of saline and change positions, take a deep breath, or cough again. Pull back on the plunger again. If there is still no blood return, finish injecting the normal saline. Disconnect the syringe and clamp the catheter.  If you cannot get a blood return or the catheter continues to be difficult to flush, it is very important to contact your caregiver. The catheter may be clogged. Your caregiver can troubleshoot and fix problems with the catheter. SEEK IMMEDIATE MEDICAL CARE IF:  You notice redness, swelling, pain, or have any type of drainage at or around the catheter insertion  site.  Your catheter is difficult to flush, or you cannot flush the catheter.  You cannot get a blood return from the catheter.  Your catheter leaks when flushed or when fluids are infused into it.  There are cracks or breaks in the catheter.  You have swelling on the arm nearest to the catheter.  You have an unexplained fever. Document Released: 03/18/2011 Document Revised: 06/21/2011 Document Reviewed: 03/18/2011 [facility] Patient Information 2014 [place], [place].   GETTING TO GOOD BOWEL HEALTH. Irregular bowel habits such as constipation and diarrhea can lead to many problems over time.  Having one soft bowel movement a day is the  most important way to prevent further problems.  The anorectal canal is designed to handle stretching and feces to safely manage our ability to get rid of solid waste (feces, poop, stool) out of our body.  BUT, hard constipated stools can act like ripping concrete bricks and diarrhea can be a burning fire to this very sensitive area of our body, causing inflamed hemorrhoids, anal fissures, increasing risk is perirectal abscesses, abdominal pain/bloating, an making irritable bowel worse.     The goal: ONE SOFT BOWEL MOVEMENT A DAY!  To have soft, regular bowel movements:    Drink at least 8 tall glasses of water a day.     Take plenty of fiber.  Fiber is the undigested part of plant food that passes into the colon, acting s "natures broom" to encourage bowel motility and movement.  Fiber can absorb and hold large amounts of water. This results in a larger, bulkier stool, which is soft and easier to pass. Work gradually over several weeks up to 6 servings a day of fiber (25g a day even more if needed) in the form of: o Vegetables -- Root (potatoes, carrots, turnips), leafy green (lettuce, salad greens, celery, spinach), or cooked high residue (cabbage, broccoli, etc) o Fruit -- Fresh (unpeeled skin & pulp), Dried (prunes, apricots, cherries, etc ),  or stewed (  applesauce)  o Whole grain breads, pasta, etc (whole wheat)  o Bran cereals    Bulking Agents -- This type of water-retaining fiber generally is easily obtained each day by one of the following:  o Psyllium bran -- The psyllium plant is remarkable because its ground seeds can retain so much water. This product is available as Metamucil, Konsyl, Effersyllium, Per Diem Fiber, or the less expensive generic preparation in drug and health food stores. Although labeled a laxative, it really is not a laxative.  o Methylcellulose -- This is another fiber derived from wood which also retains water. It is available as Citrucel. o Polyethylene Glycol - and "artificial" fiber commonly called Miralax or Glycolax.  It is helpful for people with gassy or bloated feelings with regular fiber o Flax Seed - a less gassy fiber than psyllium   No reading or other relaxing activity while on the toilet. If bowel movements take longer than 5 minutes, you are too constipated   AVOID CONSTIPATION.  High fiber and water intake usually takes care of this.  Sometimes a laxative is needed to stimulate more frequent bowel movements, but    Laxatives are not a good long-term solution as it can wear the colon out. o Osmotics (Milk of Magnesia, Fleets phosphosoda, Magnesium citrate, MiraLax, GoLytely) are safer than  o Stimulants (Senokot, Castor Oil, Dulcolax, Ex Lax)    o Do not take laxatives for more than 7days in a row.    IF SEVERELY CONSTIPATED, try a Bowel Retraining Program: o Do not use laxatives.  o Eat a diet high in roughage, such as bran cereals and leafy vegetables.  o Drink six (6) ounces of prune or apricot juice each morning.  o Eat two (2) large servings of stewed fruit each day.  o Take one (1) heaping tablespoon of a psyllium-based bulking agent twice a day. Use sugar-free sweetener when possible to avoid excessive calories.  o Eat a normal breakfast.  o Set aside 15 minutes after breakfast to sit on the  toilet, but do not strain to have a bowel movement.  o If you do not have a bowel  movement by the third day, use an enema and repeat the above steps.    Controlling diarrhea o Switch to liquids and simpler foods for a few days to avoid stressing your intestines further. o Avoid dairy products (especially milk & ice cream) for a short time.  The intestines often can lose the ability to digest lactose when stressed. o Avoid foods that cause gassiness or bloating.  Typical foods include beans and other legumes, cabbage, broccoli, and dairy foods.  Every person has some sensitivity to other foods, so listen to our body and avoid those foods that trigger problems for you. o Adding fiber (Citrucel, Metamucil, psyllium, Miralax) gradually can help thicken stools by absorbing excess fluid and retrain the intestines to act more normally.  Slowly increase the dose over a few weeks.  Too much fiber too soon can backfire and cause cramping & bloating. o Probiotics (such as active yogurt, Align, etc) may help repopulate the intestines and colon with normal bacteria and calm down a sensitive digestive tract.  Most studies show it to be of mild help, though, and such products can be costly. o Medicines:   Bismuth subsalicylate (ex. Kayopectate, Pepto Bismol) every 30 minutes for up to 6 doses can help control diarrhea.  Avoid if pregnant.   Loperamide (Immodium) can slow down diarrhea.  Start with two tablets (4mg  total) first and then try one tablet every 6 hours.  Avoid if you are having fevers or severe pain.  If you are not better or start feeling worse, stop all medicines and call your doctor for advice o Call your doctor if you are getting worse or not better.  Sometimes further testing (cultures, endoscopy, X-ray studies, bloodwork, etc) may be needed to help diagnose and treat the cause of the diarrhea. o

## 2012-09-20 ENCOUNTER — Other Ambulatory Visit: Payer: Self-pay | Admitting: *Deleted

## 2012-09-20 ENCOUNTER — Ambulatory Visit: Payer: BC Managed Care – PPO | Admitting: Nutrition

## 2012-09-20 ENCOUNTER — Other Ambulatory Visit: Payer: BC Managed Care – PPO

## 2012-09-20 ENCOUNTER — Encounter: Payer: Self-pay | Admitting: *Deleted

## 2012-09-20 DIAGNOSIS — C184 Malignant neoplasm of transverse colon: Secondary | ICD-10-CM

## 2012-09-20 MED ORDER — LIDOCAINE-PRILOCAINE 2.5-2.5 % EX CREA: TOPICAL_CREAM | CUTANEOUS | Status: DC | PRN

## 2012-09-20 MED ORDER — PROCHLORPERAZINE MALEATE 10 MG PO TABS: 10.0000 mg | ORAL_TABLET | Freq: Four times a day (QID) | ORAL | Status: DC | PRN

## 2012-09-20 NOTE — Progress Notes (Signed)
This is a 57 year old male diagnosed with colon cancer status post partial colectomy. He is a patient of Dr. Truett Perna.  Past medical history includes hyperlipidemia, anxiety, GERD, and anemia.  Medications include ferrous sulfate, multivitamin, and Zocor.  Labs include potassium 3.3 and glucose 106 on May 19.  Height: 68 inches. Weight: 165.2 pounds June 9. Usual body weight: 182 pounds 08/17/2012. BMI: 25.12.  Patient denies nutrition impact symptoms at this time. He states he eats a fairly healthy diet. He has experienced a 9% weight loss but feels comfortable and healthy at his present weight.  Nutrition diagnosis: Food and nutrition related knowledge deficit related to new diagnosis of colon cancer and associated treatments as evidenced by no prior need for nutrition related information.  Intervention: I educated patient on the importance of consuming a healthy plant-based diet with adequate calories and protein for weight maintenance. I've discussed the importance of modifying diet if patient does experience nutrition impact symptoms. I've given him fact sheets and nutrition information to take with him today. I've answered his questions. Contact information provided.  Monitoring, evaluation, goals: Patient will tolerate adequate calories and protein to promote weight maintenance with minimal him nutrition impact symptoms.  Next visit: Patient will contact me with questions or concerns.

## 2012-09-21 LAB — MRSA CULTURE

## 2012-09-21 NOTE — Progress Notes (Signed)
Patient notifiedof time change.  Made aware surgery at 1130.  Arrival at 0900am.  Nothing to eat or drink after midnite.  Patient voiced understanding.

## 2012-09-22 ENCOUNTER — Ambulatory Visit (HOSPITAL_COMMUNITY): Payer: BC Managed Care – PPO | Admitting: Anesthesiology

## 2012-09-22 ENCOUNTER — Encounter (HOSPITAL_COMMUNITY): Payer: Self-pay | Admitting: *Deleted

## 2012-09-22 ENCOUNTER — Encounter (HOSPITAL_COMMUNITY)
Admission: RE | Disposition: A | Discharge status: Home / Self Care | Payer: Self-pay | Source: Ambulatory Visit | Attending: Surgery

## 2012-09-22 ENCOUNTER — Ambulatory Visit (HOSPITAL_COMMUNITY): Payer: BC Managed Care – PPO

## 2012-09-22 ENCOUNTER — Other Ambulatory Visit: Payer: Self-pay | Admitting: *Deleted

## 2012-09-22 ENCOUNTER — Encounter (HOSPITAL_COMMUNITY): Payer: Self-pay | Admitting: Anesthesiology

## 2012-09-22 ENCOUNTER — Ambulatory Visit (HOSPITAL_COMMUNITY)
Admission: RE | Admit: 2012-09-22 | Discharge: 2012-09-22 | Disposition: A | Discharge status: Home / Self Care | Payer: BC Managed Care – PPO | Source: Ambulatory Visit | Attending: Surgery | Admitting: Surgery

## 2012-09-22 DIAGNOSIS — D126 Benign neoplasm of colon, unspecified: Secondary | ICD-10-CM | POA: Insufficient documentation

## 2012-09-22 DIAGNOSIS — K219 Gastro-esophageal reflux disease without esophagitis: Secondary | ICD-10-CM | POA: Insufficient documentation

## 2012-09-22 DIAGNOSIS — D649 Anemia, unspecified: Secondary | ICD-10-CM | POA: Insufficient documentation

## 2012-09-22 DIAGNOSIS — C184 Malignant neoplasm of transverse colon: Secondary | ICD-10-CM

## 2012-09-22 DIAGNOSIS — C183 Malignant neoplasm of hepatic flexure: Secondary | ICD-10-CM | POA: Insufficient documentation

## 2012-09-22 DIAGNOSIS — Z7982 Long term (current) use of aspirin: Secondary | ICD-10-CM | POA: Insufficient documentation

## 2012-09-22 DIAGNOSIS — Z79899 Other long term (current) drug therapy: Secondary | ICD-10-CM | POA: Insufficient documentation

## 2012-09-22 DIAGNOSIS — C189 Malignant neoplasm of colon, unspecified: Secondary | ICD-10-CM

## 2012-09-22 DIAGNOSIS — E785 Hyperlipidemia, unspecified: Secondary | ICD-10-CM | POA: Insufficient documentation

## 2012-09-22 HISTORY — PX: PORTACATH PLACEMENT: SHX2246

## 2012-09-22 LAB — CBC
HCT: 36.5 % — ABNORMAL LOW (ref 39.0–52.0)
Hemoglobin: 11 g/dL — ABNORMAL LOW (ref 13.0–17.0)
MCHC: 30.1 g/dL (ref 30.0–36.0)
RBC: 5.12 MIL/uL (ref 4.22–5.81)

## 2012-09-22 SURGERY — INSERTION, TUNNELED CENTRAL VENOUS DEVICE, WITH PORT
Anesthesia: Monitor Anesthesia Care | Wound class: Clean

## 2012-09-22 MED ORDER — LACTATED RINGERS IV SOLN
INTRAVENOUS | Status: DC
  Administered 2012-09-22 (×2): 1000 mL via INTRAVENOUS

## 2012-09-22 MED ORDER — SODIUM CHLORIDE 0.9 % IJ SOLN: 3.0000 mL | INTRAMUSCULAR | Status: DC | PRN

## 2012-09-22 MED ORDER — SODIUM CHLORIDE 0.9 % IJ SOLN: 3.0000 mL | Freq: Two times a day (BID) | INTRAMUSCULAR | Status: DC

## 2012-09-22 MED ORDER — BUPIVACAINE-EPINEPHRINE 0.25% -1:200000 IJ SOLN
INTRAMUSCULAR | Status: DC | PRN
  Administered 2012-09-22: 30 mL

## 2012-09-22 MED ORDER — MEPERIDINE HCL 50 MG/ML IJ SOLN: 6.2500 mg | INTRAMUSCULAR | Status: DC | PRN

## 2012-09-22 MED ORDER — CHLORHEXIDINE GLUCONATE 4 % EX LIQD: 1.0000 "application " | Freq: Once | CUTANEOUS | Status: DC

## 2012-09-22 MED ORDER — PROPOFOL INFUSION 10 MG/ML OPTIME
INTRAVENOUS | Status: DC | PRN
  Administered 2012-09-22: 160 ug/kg/min via INTRAVENOUS

## 2012-09-22 MED ORDER — LIDOCAINE HCL 1 % IJ SOLN
INTRAMUSCULAR | Status: DC | PRN
  Administered 2012-09-22: 50 mg via INTRADERMAL

## 2012-09-22 MED ORDER — OXYCODONE HCL 5 MG PO TABS: 5.0000 mg | ORAL_TABLET | ORAL | Status: DC | PRN

## 2012-09-22 MED ORDER — LIDOCAINE HCL 1 % IJ SOLN
INTRAMUSCULAR | Status: AC
  Filled 2012-09-22: qty 20

## 2012-09-22 MED ORDER — MIDAZOLAM HCL 5 MG/5ML IJ SOLN
INTRAMUSCULAR | Status: DC | PRN
  Administered 2012-09-22: 2 mg via INTRAVENOUS
  Administered 2012-09-22: 1 mg via INTRAVENOUS

## 2012-09-22 MED ORDER — CAPECITABINE 500 MG PO TABS: 1500.0000 mg | ORAL_TABLET | Freq: Two times a day (BID) | ORAL | Status: DC

## 2012-09-22 MED ORDER — HEPARIN SOD (PORK) LOCK FLUSH 100 UNIT/ML IV SOLN
INTRAVENOUS | Status: DC | PRN
  Administered 2012-09-22: 500 [IU] via INTRAVENOUS

## 2012-09-22 MED ORDER — ACETAMINOPHEN 325 MG PO TABS: 650.0000 mg | ORAL_TABLET | ORAL | Status: DC | PRN

## 2012-09-22 MED ORDER — PROMETHAZINE HCL 25 MG/ML IJ SOLN: 6.2500 mg | INTRAMUSCULAR | Status: DC | PRN

## 2012-09-22 MED ORDER — SODIUM CHLORIDE 0.9 % IR SOLN
Freq: Once | Status: AC
  Administered 2012-09-22: 13:00:00
  Filled 2012-09-22: qty 1.2

## 2012-09-22 MED ORDER — ACETAMINOPHEN 650 MG RE SUPP
650.0000 mg | RECTAL | Status: DC | PRN
  Filled 2012-09-22: qty 1

## 2012-09-22 MED ORDER — SODIUM CHLORIDE 0.9 % IR SOLN: Freq: Once | Status: DC

## 2012-09-22 MED ORDER — CEFAZOLIN SODIUM-DEXTROSE 2-3 GM-% IV SOLR
2.0000 g | INTRAVENOUS | Status: AC
  Administered 2012-09-22: 2 g via INTRAVENOUS

## 2012-09-22 MED ORDER — CHLORHEXIDINE GLUCONATE 4 % EX LIQD
1.0000 "application " | Freq: Once | CUTANEOUS | Status: DC
  Filled 2012-09-22: qty 15

## 2012-09-22 MED ORDER — FENTANYL CITRATE 0.05 MG/ML IJ SOLN: 25.0000 ug | INTRAMUSCULAR | Status: DC | PRN

## 2012-09-22 MED ORDER — LIDOCAINE HCL (PF) 1 % IJ SOLN
INTRAMUSCULAR | Status: DC | PRN
  Administered 2012-09-22: 10 mL

## 2012-09-22 MED ORDER — FENTANYL CITRATE 0.05 MG/ML IJ SOLN
INTRAMUSCULAR | Status: DC | PRN
  Administered 2012-09-22: 100 ug via INTRAVENOUS
  Administered 2012-09-22: 25 ug via INTRAVENOUS

## 2012-09-22 MED ORDER — CEFAZOLIN SODIUM-DEXTROSE 2-3 GM-% IV SOLR
INTRAVENOUS | Status: AC
  Filled 2012-09-22: qty 50

## 2012-09-22 MED ORDER — OXYCODONE HCL 5 MG PO TABS: 5.0000 mg | ORAL_TABLET | Freq: Once | ORAL | Status: DC | PRN

## 2012-09-22 MED ORDER — HEPARIN SOD (PORK) LOCK FLUSH 100 UNIT/ML IV SOLN
INTRAVENOUS | Status: AC
  Filled 2012-09-22: qty 5

## 2012-09-22 MED ORDER — ACETAMINOPHEN 10 MG/ML IV SOLN: 1000.0000 mg | Freq: Once | INTRAVENOUS | Status: DC | PRN

## 2012-09-22 MED ORDER — OXYCODONE HCL 5 MG/5ML PO SOLN
5.0000 mg | Freq: Once | ORAL | Status: DC | PRN
  Filled 2012-09-22: qty 5

## 2012-09-22 MED ORDER — ONDANSETRON HCL 4 MG/2ML IJ SOLN: 4.0000 mg | Freq: Four times a day (QID) | INTRAMUSCULAR | Status: DC | PRN

## 2012-09-22 MED ORDER — HYDROMORPHONE HCL PF 1 MG/ML IJ SOLN: 0.2500 mg | INTRAMUSCULAR | Status: DC | PRN

## 2012-09-22 MED ORDER — SODIUM CHLORIDE 0.9 % IV SOLN: 250.0000 mL | INTRAVENOUS | Status: DC | PRN

## 2012-09-22 MED ORDER — BUPIVACAINE-EPINEPHRINE PF 0.25-1:200000 % IJ SOLN
INTRAMUSCULAR | Status: AC
  Filled 2012-09-22: qty 30

## 2012-09-22 SURGICAL SUPPLY — 36 items
BAG DECANTER FOR FLEXI CONT (MISCELLANEOUS) ×2 IMPLANT
BLADE HEX COATED 2.75 (ELECTRODE) IMPLANT
BLADE SURG SZ11 CARB STEEL (BLADE) ×2 IMPLANT
CHLORAPREP W/TINT 26ML (MISCELLANEOUS) ×2 IMPLANT
CLOTH BEACON ORANGE TIMEOUT ST (SAFETY) ×2 IMPLANT
COVER PROBE W GEL 5X96 (DRAPES) ×2 IMPLANT
COVER SURGICAL LIGHT HANDLE (MISCELLANEOUS) IMPLANT
DECANTER SPIKE VIAL GLASS SM (MISCELLANEOUS) ×2 IMPLANT
DRAPE C-ARM 42X120 X-RAY (DRAPES) ×2 IMPLANT
DRAPE PED LAPAROTOMY (DRAPES) ×2 IMPLANT
DRSG TEGADERM 2-3/8X2-3/4 SM (GAUZE/BANDAGES/DRESSINGS) ×4 IMPLANT
DRSG TEGADERM 4X4.75 (GAUZE/BANDAGES/DRESSINGS) ×4 IMPLANT
ELECT REM PT RETURN 9FT ADLT (ELECTROSURGICAL) ×2
ELECTRODE REM PT RTRN 9FT ADLT (ELECTROSURGICAL) ×1 IMPLANT
GAUZE SPONGE 2X2 8PLY STRL LF (GAUZE/BANDAGES/DRESSINGS) ×2 IMPLANT
GAUZE SPONGE 4X4 16PLY XRAY LF (GAUZE/BANDAGES/DRESSINGS) ×2 IMPLANT
GLOVE BIOGEL PI IND STRL 7.0 (GLOVE) ×1 IMPLANT
GLOVE BIOGEL PI INDICATOR 7.0 (GLOVE) ×1
GLOVE ECLIPSE 8.0 STRL XLNG CF (GLOVE) ×2 IMPLANT
GLOVE INDICATOR 8.0 STRL GRN (GLOVE) ×4 IMPLANT
GOWN STRL NON-REIN LRG LVL3 (GOWN DISPOSABLE) ×4 IMPLANT
GOWN STRL REIN XL XLG (GOWN DISPOSABLE) ×2 IMPLANT
KIT BASIN OR (CUSTOM PROCEDURE TRAY) ×2 IMPLANT
KIT PORT POWER 8FR ISP CVUE (Catheter) ×2 IMPLANT
KIT POWER CATH 8FR (Catheter) ×2 IMPLANT
NEEDLE HYPO 25X1 1.5 SAFETY (NEEDLE) ×2 IMPLANT
NS IRRIG 1000ML POUR BTL (IV SOLUTION) ×2 IMPLANT
PACK BASIC VI WITH GOWN DISP (CUSTOM PROCEDURE TRAY) ×2 IMPLANT
PENCIL BUTTON HOLSTER BLD 10FT (ELECTRODE) ×2 IMPLANT
SPONGE GAUZE 2X2 STER 10/PKG (GAUZE/BANDAGES/DRESSINGS) ×2
SUT MON AB 4-0 SH 27 (SUTURE) ×2 IMPLANT
SUT PROLENE 2 0 SH DA (SUTURE) ×4 IMPLANT
SYR BULB IRRIGATION 50ML (SYRINGE) IMPLANT
SYR CONTROL 10ML LL (SYRINGE) ×2 IMPLANT
SYRINGE 10CC LL (SYRINGE) ×2 IMPLANT
TOWEL OR 17X26 10 PK STRL BLUE (TOWEL DISPOSABLE) ×4 IMPLANT

## 2012-09-22 NOTE — Transfer of Care (Signed)
Immediate Anesthesia Transfer of Care Note  Patient: Jason Bray  Procedure(s) Performed: Procedure(s): INSERTION PORT-A-CATH UNDER ULTRASOUND & FLUROSCOPIC GUIDANCE (N/A)  Patient Location: PACU  Anesthesia Type:MAC  Level of Consciousness: awake and sedated  Airway & Oxygen Therapy: Patient Spontanous Breathing and Patient connected to face mask oxygen  Post-op Assessment: Report given to PACU RN and Post -op Vital signs reviewed and stable  Post vital signs: Reviewed and stable  Complications: No apparent anesthesia complications

## 2012-09-22 NOTE — Progress Notes (Signed)
Initial script for Xeloda forwarded to managed care department.

## 2012-09-22 NOTE — H&P (View-Only) (Signed)
Subjective:     Patient ID: Jason Bray, male   DOB: 08-May-1955, 57 y.o.   MRN: [id number]  HPI  AZARIAS CHIOU  February 02, 1956 [id number]  Patient Care Team: Lenora Boys, MD as PCP - General (Family Medicine) Shirley Friar, MD as Consulting Physician (Gastroenterology) Ardeth Sportsman, MD as Consulting Physician (General Surgery) Wendall Stade, MD as Consulting Physician (Cardiology)  This patient is a 57 y.o.male who presents today for surgical evaluation s/p surgery 08/25/2012  POST-OPERATIVE DIAGNOSIS: cancer hepatic flexure and polyp mid transverse colon  PROCEDURE: Procedure(s):  LAPAROSCOPIC PARTIAL COLECTOMY  SURGEON: Surgeon(s):  Ardeth Sportsman, MD  Romie Levee, MD - Asst   Patient comes today feeling well.  They will.  Daily bowel movements.  No fevers or chills.  Energy level getting better.  Wants to get back to golfing.  He is seen He see medical oncology.  He is felt to benefit for chemotherapy.  Request made for placement of portacatheter.  Patient Active Problem List   Diagnosis Date Noted  . 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 Adenomatous colon polyp - prox descending colon 08/09/2012  . HYPERLIPIDEMIA 07/09/2009  . ANXIETY 07/09/2009  . CHEST PAIN 07/09/2009    Past Medical History  Diagnosis Date  . Hyperlipidemia   . Anxiety   . GERD (gastroesophageal reflux disease)   . Cancer     cancerous colon polyp  . Anemia     Past Surgical History  Procedure Laterality Date  . Cardiac catheterization  06/2009    non cardiac  . Colonoscopy    . Laparoscopic partial colectomy N/A 08/25/2012    Procedure: LAPAROSCOPIC PARTIAL COLECTOMY  ;  Surgeon: Ardeth Sportsman, MD;  Location: [facility];  Service: General;  Laterality: N/A;    History   Social History  . Marital Status: Married    Spouse Name: Lanora Manis    Number of Children: 3  . Years of Education: N/A   Occupational History  . Merchant navy officer    Social History Main Topics  . Smoking status: Former Smoker -- 1.00 packs/day for 20 years    Types: Cigarettes    Quit date: 01/19/2007  . Smokeless tobacco: Former Neurosurgeon    Types: Chew    Quit date: 01/18/1998  . Alcohol Use: 0.0 oz/week     Comment: weekly   . Drug Use: No  . Sexually Active: Not on file   Other Topics Concern  . 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 Lymphoma Mother   . Hypertension Father   . Hyperlipidemia Father   . Hypertension Brother     Current Outpatient Prescriptions  Medication Sig Dispense Refill  . ferrous sulfate 325 (65 FE) MG tablet Take 325 mg by mouth daily with breakfast.      . metoprolol succinate (TOPROL-XL) 50 MG 24 hr tablet Take 50 mg by mouth at bedtime. Take with or immediately following a meal.      . naproxen (NAPROSYN) 500 MG tablet Take 1 tablet (500 mg total) by mouth 2 (two) times daily with a meal.  40 tablet  2  . oxyCODONE (OXY IR/ROXICODONE) 5 MG immediate release tablet Take 1-2 tablets (5-10  mg total) by mouth every 4 (four) hours as needed for pain.  50 tablet  0  . simvastatin (ZOCOR) 20 MG tablet Take 20 mg by mouth at bedtime.      Marland Kitchen aspirin 325 MG tablet Take 325 mg by mouth 3 (three) times a week.       . Multiple Vitamins-Iron (MULTIVITAMIN/IRON PO) Take 1 tablet by mouth daily.      Marland Kitchen OVER THE COUNTER MEDICATION Take 1 Package by mouth daily. Vitamin supplement pack---Mega Men Supplement       No current facility-administered medications for this visit.     No Known Allergies  BP 160/94  Pulse 68  Temp(Src) 97.5 F (36.4 C) (Temporal)  Resp 18  Ht 5' 7.5" (1.715 m)  Wt 165 lb 12.8 oz (75.206 kg)  BMI 25.57 kg/m2  No results found.   Review of Systems  Constitutional: Negative for fever, chills and  diaphoresis.  HENT: Negative for sore throat, trouble swallowing and neck pain.   Eyes: Negative for photophobia and visual disturbance.  Respiratory: Negative for choking and shortness of breath.   Cardiovascular: Negative for chest pain and palpitations.  Gastrointestinal: Negative for nausea, vomiting, abdominal distention, anal bleeding and rectal pain.  Genitourinary: Negative for dysuria, urgency, difficulty urinating and testicular pain.  Musculoskeletal: Negative for myalgias, arthralgias and gait problem.  Skin: Negative for color change and rash.  Neurological: Negative for dizziness, speech difficulty, weakness and numbness.  Hematological: Negative for adenopathy.  Psychiatric/Behavioral: Negative for hallucinations, confusion and agitation.       Objective:   Physical Exam  Constitutional: He is oriented to person, place, and time. He appears well-developed and well-nourished. No distress.  HENT:  Head: Normocephalic.  Mouth/Throat: Oropharynx is clear and moist. No oropharyngeal exudate.  Eyes: Conjunctivae and EOM are normal. Pupils are equal, round, and reactive to light. No scleral icterus.  Neck: Normal range of motion. No tracheal deviation present.  Cardiovascular: Normal rate, normal heart sounds and intact distal pulses.   Pulmonary/Chest: Effort normal. No respiratory distress.  Abdominal: Soft. He exhibits no distension. There is no tenderness. Hernia confirmed negative in the right inguinal area and confirmed negative in the left inguinal area.  Incisions clean with normal healing ridges.  No hernias  Musculoskeletal: Normal range of motion. He exhibits no tenderness.  Neurological: He is alert and oriented to person, place, and time. No cranial nerve deficit. He exhibits normal muscle tone. Coordination normal.  Skin: Skin is warm and dry. No rash noted. He is not diaphoretic.  Psychiatric: He has a normal mood and affect. His behavior is normal.   ADDITIONAL  INFORMATION: The tumor block was sent to [facility] for MSI testing. Microsatellite instability PCR: microsatellite stable. (BAT-26, NR-21, BAT-25, MONO-27 and NR-24: stable; accession number: [id number]) Abigail Miyamoto MD Pathologist, Electronic Signature ( Signed 09/07/2012) The tumor block was sent to [facility] for MSI testing: IHC report: no loss of expression of MSH-6, PMS-2, MLH-1 and MSH-2 ([id number], [facility] reference number). Abigail Miyamoto MD Pathologist, Electronic Signature ( Signed 09/06/2012) FINAL DIAGNOSIS Diagnosis Colon, segmental resection for tumor, proximal - INVASIVE MODERATELY DIFFERENTIATED ADENOCARCINOMA, INVADING THROUGH THE MUSCULARIS PROPRIA INTO PERICOLONIC FATTY TISSUE; NO EVIDENCE OF ANGIOLYMPHATIC INVASION IDENTIFIED. - A LARGE TUBULAR ADENOMA WITH NO EVIDENCE OF HIGH GRADE DYSPLASIA OR MALIGNANCY (1.7 CM). - ONE OF TWENTY-EIGHT LYMPH NODES, POSITIVE FOR METASTATIC CARCINOMA WITH EXTRANODAL EXTENSION (1/28) - RESECTION MARGINS, NEGATIVE FOR MALIGNANCY. 1 of 3 FINAL for WITTEN, CERTAIN ([id number])  Microscopic Comment COLON AND RECTUM Specimen: Proximal colon Procedure: Segmental resection Tumor site: Transverse colon Specimen integrity: Intact Macroscopic intactness of mesorectum: N/A Macroscopic tumor perforation: No Invasive tumor: Maximum size: 6.0 cm, Nelda Luckey measurement Histologic type(s): Invasive adenocarcinoma Histologic grade and differentiation: G2: moderately differentiated/low grade Type of polyp in which invasive carcinoma arose: N/A Microscopic extension of invasive tumor: Invading through the muscularis propria into pericolonic fatty tissue. Lymph-Vascular invasion: Not identified Peri-neural invasion: Not identified Tumor deposit(s) (discontinuous extramural extension): N/A Resection margins: Negative Proximal margin: 20 cm Distal margin: 21 cm Circumferential (radial) (posterior ascending, posterior descending; lateral and  posterior mid-rectum; and entire lower 1/3 rectum): 4.5 cm Treatment effect (neoadjuvant therapy): No Number of lymph nodes examined 28; Number positive 1 Additional polyp(s): A large tubular adenoma (1.7 cm) with no evidence of high grade dysplasia or malignancy Non-neoplastic findings: N/A Pathologic Staging: pT3,pN1a,pMX Ancillary studies: A tumor block will be sent out for MSI testing and an addendum report will follow. (HCL:kh 08/29/18) Abigail Miyamoto MD Pathologist, Electronic Signature (Case signed 08/28/2012) Specimen Haili Donofrio and Clinical Information Specimen(s) Obtained: Colon, segmental resection for tumor, proximal Specimen Clinical Information cancer hepatic flexor polyp mid transverse colon (kp) Vamsi Apfel Specimen: Proximal colon, received in formalin Specimen integrity: Intact Specimen length: The specimen consists of 10 cm of terminal ileum and 35 cm of colon Tumor location: The tumor is located in the ascending colon and is circumferential. Tumor size: The tumor consists of a 6 x 3.8 cm sessile, ulcerated mass which has a maximum thickness of 1 cm. Percent of bowel circumference involved: 100% Tumor distance to margins: Proximal: 20 cm Distal: 21 cm Radial (posterior ascending, posterior descending; lateral and posterior mid-rectum; and entire lower 1/3 rectum): 4.5 cm 2 of 3 FINAL for CAYDN, JUSTEN ([id number]) Annalycia Done(continued) Macroscopic extent of tumor invasion: Tumor invades through the muscularis propria into the subserosal adipose tissue but does not involve the serosa. Total presumed lymph nodes: There are 26 rubbery tan red ovoid nodules tentatively identified as lymph nodes varying in size from 0.3 to 0.8 cm in greatest dimensions. Extramural satellite tumor nodules: Not grossly identified. Mucosal polyp(s): 5 cm from the distal margin there is a 1.7 x 1.5 x 1 cm sessile, rubbery mucosal polyp. Additional findings: The uninvolved mucosa is glistening and  tan. The appendix is present and measures 4.2 cm in length x 0.6 cm in diameter. Block summary: Fifteen blocks submitted A = proximal margin B = distal margin C = proximal tumor to uninvolved mucosa transition D = distal tumor to uninvolved mucosa transition E = deep tumor extension F = tumor G = tissue for molecular testing H, I = mucosal polyp J = appendix K-O = lymph nodes (GRP:kh 08-28-12) Report signed out from the following location(s) [facility] [address],[place] [address].CLIA:[id number],CAP:[id number]., [facility] [address], [place], [place] [address]. CLIA #: [id number], 3 of    Assessment:     Recovering well status post resection for node-positive cancer Of hepatic flexure, recovering well 3 weeks post-op    Plan:     Increase activity as tolerated to regular activity.  Low impact exercise such as walking an hour a day at least ideal.  Do not push through pain.  Diet as tolerated.  Low fat high fiber diet ideal.  Bowel regimen with 30 g fiber a day and fiber supplement as needed to avoid problems.  Return to clinic as needed.   Instructions discussed.  Followup with primary care  physician for other health issues as would normally be done.  Questions answered.  The patient expressed understanding and appreciation  Placement porta catheter later this week.  I discussed the procedure with him:  Use of a central venous catheter for intravenous therapy was discussed.  Technique of catheter placement using ultrasound and fluoroscopy guidance was discussed.  Risks such as bleeding, infection, pneumothorax, catheter occlusion, reoperation, and other risks were discussed.   I noted a good likelihood this will help address the problem.  Questions were answered.  The patient expressed understanding & wishes to proceed.

## 2012-09-22 NOTE — Op Note (Signed)
09/22/2012  1:22 PM  PATIENT:  Jason Bray  57 y.o. male  Patient Care Team: Lenora Boys, MD as PCP - General (Family Medicine) Shirley Friar, MD as Consulting Physician (Gastroenterology) Ardeth Sportsman, MD as Consulting Physician (General Surgery) Wendall Stade, MD as Consulting Physician (Cardiology)  PRE-OPERATIVE DIAGNOSIS:  colon cancer   POST-OPERATIVE DIAGNOSIS:  colon cancer, need for chemotherapy  PROCEDURE:  Procedure(s): INSERTION PORT-A-CATH UNDER ULTRASOUND & FLUROSCOPIC GUIDANCE  SURGEON:  Surgeon(s): Ardeth Sportsman, MD  ASSISTANT: RN  ANESTHESIA:   local and regional  EBL:     Delay start of Pharmacological VTE agent (>24hrs) due to surgical blood loss or risk of bleeding:  no  DRAINS: none   SPECIMEN:  No Specimen  DISPOSITION OF SPECIMEN:  N/A  COUNTS:  YES  PLAN OF CARE: Discharge to home after PACU  PATIENT DISPOSITION:  PACU - hemodynamically stable.  Indications:  Patient with need for IV therapy for chemotherapy of LN + colon cancer  Port-A-Cath placement was requested.  Use of a central venous catheter for intravenous therapy was discussed.  Technique of catheter placement using ultrasound and fluoroscopy guidance was discussed.  Risks such as bleeding, infection, pneumothorax, catheter occlusion, reoperation, and other risks were discussed.   I noted a good likelihood this will help address the problem.  Questions were answered.  The patient expressed understanding & wishes to proceed.  Findings:   Normal-appearing anatomy.  Is an 8 Art therapist. It goes through the right internal jugular vein  Procedure: Informed consent was confirmed. Patient was brought the operating room. and positioned supine. Arms were tucked. The patient underwent deep sedation. Neck and chest were clipped and prepped and draped in a sterile fashion. A surgical timeout confirmed our plan.  I placed a field block of local anesthesia on the  neck & chest  I used the ultrasound to identify the right internal jugular vein. I entered into it using on the first venipuncture under direct ultrasound guidance. Wire was passed into the inferior vena cava by fluoroscopy.  I made an incision in the lateral infraclavicular pocket. Made a subcutaneous pocket. I tunneled the power port from the chest wound to the neck puncture site. The port was secured to the left anterior chest wall using 2-0 Prolene interrupted stitches x4. Catheter flushed well.  I used a dilator on the wire using Seldinger technique to dilate the neck tract. Then I used a dilator with a peel away sheath.  We used fluoroscopy.  I pulled the wire back into the right atrial/superior vena cava junction.  I pulled the wire back until it was at the neck puncture site. This gave the true measurement of the intravenous segment. I cut the catheter to appropriate length. I removed the wire and dilator. The catheter was placed within the sheath. The sheath was peeled away.  Fluoroscopy confirmed the tip in the proximal right atrium.  Catheter aspirated OK but flushed well.  I pulled the cathter out 2cm & ensured no kinks in the SQ.  On final fluoro reevaluation the tip seen to be in good position in the distal SVC.  Catheter aspirated better & flushed well.      I closed the wounds using 4 Monocryl stitch & placed sterile dressings. Patient should go home later today. Catheter is okay to use.

## 2012-09-22 NOTE — Interval H&P Note (Signed)
History and Physical Interval Note:  09/22/2012 11:12 AM  Jason Bray  has presented today for surgery, with the diagnosis of colon cancer   The various methods of treatment have been discussed with the patient and family. After consideration of risks, benefits and other options for treatment, the patient has consented to  Procedure(s): INSERTION PORT-A-CATH UNDER ULTRASOUND & FLUROSCOPIC GUIDANCE (N/A) as a surgical intervention .  The patient's history has been reviewed, patient examined, no change in status, stable for surgery.  I have reviewed the patient's chart and labs.  Questions were answered to the patient's satisfaction.     [name] C.

## 2012-09-22 NOTE — Anesthesia Postprocedure Evaluation (Signed)
  Anesthesia Post-op Note  Patient: Jason Bray  Procedure(s) Performed: Procedure(s) (LRB): INSERTION PORT-A-CATH UNDER ULTRASOUND & FLUROSCOPIC GUIDANCE (N/A)  Patient Location: PACU  Anesthesia Type: MAC  Level of Consciousness: awake and alert   Airway and Oxygen Therapy: Patient Spontanous Breathing  Post-op Pain: mild  Post-op Assessment: Post-op Vital signs reviewed, Patient's Cardiovascular Status Stable, Respiratory Function Stable, Patent Airway and No signs of Nausea or vomiting  Last Vitals:  Filed Vitals:   09/22/12 1418  BP: 132/80  Pulse: 75  Temp: 36.5 C  Resp: 14    Post-op Vital Signs: stable   Complications: No apparent anesthesia complications

## 2012-09-22 NOTE — Anesthesia Preprocedure Evaluation (Addendum)
Anesthesia Evaluation  Patient identified by MRN, date of birth, ID band Patient awake    Reviewed: Allergy & Precautions, H&P , NPO status , Patient's Chart, lab work & pertinent test results  Airway Mallampati: II TM Distance: >3 FB Neck ROM: Full    Dental no notable dental hx. (+) Dental Advisory Given   Pulmonary former smoker,  breath sounds clear to auscultation  Pulmonary exam normal       Cardiovascular Exercise Tolerance: Good + angina Rhythm:Regular Rate:Normal  ECG reviewed. One episode in 2011 with negative workup by Dr. Eden Emms. Released by Dr. Eden Emms.   Very active. Treadmill daily without symptoms.   Neuro/Psych Anxiety negative neurological ROS     GI/Hepatic Neg liver ROS, GERD-  ,  Endo/Other  negative endocrine ROS  Renal/GU negative Renal ROS     Musculoskeletal negative musculoskeletal ROS (+)   Abdominal   Peds  Hematology negative hematology ROS (+)   Anesthesia Other Findings   Reproductive/Obstetrics                          Anesthesia Physical  Anesthesia Plan  ASA: II  Anesthesia Plan: MAC   Post-op Pain Management:    Induction: Intravenous  Airway Management Planned:   Additional Equipment:   Intra-op Plan:   Post-operative Plan:   Informed Consent: I have reviewed the patients History and Physical, chart, labs and discussed the procedure including the risks, benefits and alternatives for the proposed anesthesia with the patient or authorized representative who has indicated his/her understanding and acceptance.   Dental advisory given  Plan Discussed with: CRNA  Anesthesia Plan Comments:       Anesthesia Quick Evaluation

## 2012-09-25 ENCOUNTER — Encounter: Payer: Self-pay | Admitting: Oncology

## 2012-09-25 ENCOUNTER — Telehealth: Payer: Self-pay | Admitting: Oncology

## 2012-09-25 ENCOUNTER — Encounter (HOSPITAL_COMMUNITY): Payer: Self-pay | Admitting: Surgery

## 2012-09-25 NOTE — Telephone Encounter (Signed)
Called pt and left message regarding chemo on june 20th , also reminded pt to get calendar for [facility] 2014

## 2012-09-25 NOTE — Progress Notes (Signed)
Faxed xeloda prescription to [facility] °

## 2012-09-29 ENCOUNTER — Other Ambulatory Visit: Payer: Self-pay | Admitting: Oncology

## 2012-09-29 ENCOUNTER — Ambulatory Visit (HOSPITAL_BASED_OUTPATIENT_CLINIC_OR_DEPARTMENT_OTHER): Payer: BC Managed Care – PPO

## 2012-09-29 DIAGNOSIS — Z5111 Encounter for antineoplastic chemotherapy: Secondary | ICD-10-CM

## 2012-09-29 DIAGNOSIS — C184 Malignant neoplasm of transverse colon: Secondary | ICD-10-CM

## 2012-09-29 MED ORDER — OXALIPLATIN CHEMO INJECTION 100 MG/20ML
130.0000 mg/m2 | Freq: Once | INTRAVENOUS | Status: AC
  Administered 2012-09-29: 245 mg via INTRAVENOUS
  Filled 2012-09-29: qty 49

## 2012-09-29 MED ORDER — SODIUM CHLORIDE 0.9 % IJ SOLN
10.0000 mL | INTRAMUSCULAR | Status: DC | PRN
  Administered 2012-09-29: 10 mL
  Filled 2012-09-29: qty 10

## 2012-09-29 MED ORDER — HEPARIN SOD (PORK) LOCK FLUSH 100 UNIT/ML IV SOLN
500.0000 [IU] | Freq: Once | INTRAVENOUS | Status: AC | PRN
  Administered 2012-09-29: 500 [IU]
  Filled 2012-09-29: qty 5

## 2012-09-29 MED ORDER — ONDANSETRON 8 MG/50ML IVPB (CHCC)
8.0000 mg | Freq: Once | INTRAVENOUS | Status: AC
  Administered 2012-09-29: 8 mg via INTRAVENOUS

## 2012-09-29 MED ORDER — DEXAMETHASONE SODIUM PHOSPHATE 10 MG/ML IJ SOLN
10.0000 mg | Freq: Once | INTRAMUSCULAR | Status: AC
  Administered 2012-09-29: 10 mg via INTRAVENOUS

## 2012-09-29 MED ORDER — DEXTROSE 5 % IV SOLN
Freq: Once | INTRAVENOUS | Status: AC
  Administered 2012-09-29: 14:00:00 via INTRAVENOUS

## 2012-09-29 NOTE — Patient Instructions (Addendum)
[facility] Discharge Instructions for Patients Receiving Chemotherapy  Today you received the following chemotherapy agents: oxaliplatin  To help prevent nausea and vomiting after your treatment, we encourage you to take your nausea medication.  Take it as often as prescribed.     If you develop nausea and vomiting that is not controlled by your nausea medication, call the clinic. If it is after clinic hours your family physician or the after hours number for the clinic or go to the Emergency Department.   BELOW ARE SYMPTOMS THAT SHOULD BE REPORTED IMMEDIATELY:  *FEVER GREATER THAN 100.5 F  *CHILLS WITH OR WITHOUT FEVER  NAUSEA AND VOMITING THAT IS NOT CONTROLLED WITH YOUR NAUSEA MEDICATION  *UNUSUAL SHORTNESS OF BREATH  *UNUSUAL BRUISING OR BLEEDING  TENDERNESS IN MOUTH AND THROAT WITH OR WITHOUT PRESENCE OF ULCERS  *URINARY PROBLEMS  *BOWEL PROBLEMS  UNUSUAL RASH Items with * indicate a potential emergency and should be followed up as soon as possible.  One of the nurses will contact you 24 hours after your treatment. Please let the nurse know about any problems that you may have experienced. Feel free to call the clinic you have any questions or concerns. The clinic phone number is [phone].   I have been informed and understand all the instructions given to me. I know to contact the clinic, my physician, or go to the Emergency Department if any problems should occur. I do not have any questions at this time, but understand that I may call the clinic during office hours   should I have any questions or need assistance in obtaining follow up care.    __________________________________________  _____________  __________ Signature of Patient or Authorized Representative            Date                   Time    __________________________________________ Nurse's Signature    Oxaliplatin Injection What is this medicine? OXALIPLATIN (ox AL i PLA  tin) is a chemotherapy drug. It targets fast dividing cells, like cancer cells, and causes these cells to die. This medicine is used to treat cancers of the colon and rectum, and many other cancers. This medicine may be used for other purposes; ask your health care provider or pharmacist if you have questions. What should I tell my health care provider before I take this medicine? They need to know if you have any of these conditions: -kidney disease -an unusual or allergic reaction to oxaliplatin, other chemotherapy, other medicines, foods, dyes, or preservatives -pregnant or trying to get pregnant -breast-feeding How should I use this medicine? This drug is given as an infusion into a vein. It is administered in a hospital or clinic by a specially trained health care professional. Talk to your pediatrician regarding the use of this medicine in children. Special care may be needed. Overdosage: If you think you have taken too much of this medicine contact a poison control center or emergency room at once. NOTE: This medicine is only for you. Do not share this medicine with others. What if I miss a dose? It is important not to miss a dose. Call your doctor or health care professional if you are unable to keep an appointment. What may interact with this medicine? -medicines to increase blood counts like filgrastim, pegfilgrastim, sargramostim -probenecid -some antibiotics like amikacin, gentamicin, neomycin, polymyxin B, streptomycin, tobramycin -zalcitabine Talk to your doctor or health care professional before taking  any of these medicines: -acetaminophen -aspirin -ibuprofen -ketoprofen -naproxen This list may not describe all possible interactions. Give your health care provider a list of all the medicines, herbs, non-prescription drugs, or dietary supplements you use. Also tell them if you smoke, drink alcohol, or use illegal drugs. Some items may interact with your medicine. What should  I watch for while using this medicine? Your condition will be monitored carefully while you are receiving this medicine. You will need important blood work done while you are taking this medicine. This medicine can make you more sensitive to cold. Do not drink cold drinks or use ice. Cover exposed skin before coming in contact with cold temperatures or cold objects. When out in cold weather wear warm clothing and cover your mouth and nose to warm the air that goes into your lungs. Tell your doctor if you get sensitive to the cold. This drug may make you feel generally unwell. This is not uncommon, as chemotherapy can affect healthy cells as well as cancer cells. Report any side effects. Continue your course of treatment even though you feel ill unless your doctor tells you to stop. In some cases, you may be given additional medicines to help with side effects. Follow all directions for their use. Call your doctor or health care professional for advice if you get a fever, chills or sore throat, or other symptoms of a cold or flu. Do not treat yourself. This drug decreases your body's ability to fight infections. Try to avoid being around people who are sick. This medicine may increase your risk to bruise or bleed. Call your doctor or health care professional if you notice any unusual bleeding. Be careful brushing and flossing your teeth or using a toothpick because you may get an infection or bleed more easily. If you have any dental work done, tell your dentist you are receiving this medicine. Avoid taking products that contain aspirin, acetaminophen, ibuprofen, naproxen, or ketoprofen unless instructed by your doctor. These medicines may hide a fever. Do not become pregnant while taking this medicine. Women should inform their doctor if they wish to become pregnant or think they might be pregnant. There is a potential for serious side effects to an unborn child. Talk to your health care professional or  pharmacist for more information. Do not breast-feed an infant while taking this medicine. Call your doctor or health care professional if you get diarrhea. Do not treat yourself. What side effects may I notice from receiving this medicine? Side effects that you should report to your doctor or health care professional as soon as possible: -allergic reactions like skin rash, itching or hives, swelling of the face, lips, or tongue -low blood counts - This drug may decrease the number of white blood cells, red blood cells and platelets. You may be at increased risk for infections and bleeding. -signs of infection - fever or chills, cough, sore throat, pain or difficulty passing urine -signs of decreased platelets or bleeding - bruising, pinpoint red spots on the skin, black, tarry stools, nosebleeds -signs of decreased red blood cells - unusually weak or tired, fainting spells, lightheadedness -breathing problems -chest pain, pressure -cough -diarrhea -jaw tightness -mouth sores -nausea and vomiting -pain, swelling, redness or irritation at the injection site -pain, tingling, numbness in the hands or feet -problems with balance, talking, walking -redness, blistering, peeling or loosening of the skin, including inside the mouth -trouble passing urine or change in the amount of urine Side effects that usually do  not require medical attention (report to your doctor or health care professional if they continue or are bothersome): -changes in vision -constipation -hair loss -loss of appetite -metallic taste in the mouth or changes in taste -stomach pain This list may not describe all possible side effects. Call your doctor for medical advice about side effects. You may report side effects to FDA at 1-800-FDA-1088. Where should I keep my medicine? This drug is given in a hospital or clinic and will not be stored at home. NOTE: This sheet is a summary. It may not cover all possible information.  If you have questions about this medicine, talk to your doctor, pharmacist, or health care provider.  2012, [employer]vier/[employer]. (10/24/2007 5:22:47 PM)

## 2012-10-02 ENCOUNTER — Telehealth: Payer: Self-pay | Admitting: *Deleted

## 2012-10-02 NOTE — Telephone Encounter (Signed)
Patient  Jason Bray to work today.  Spoke with wife Jason Bray.  Says he is having a hard time drinking room temperature beverages and doesn't feel he is quenching his thirst.  Asked when he can have cold beverages.  Cold sensation walking across the floor.  Getting up in middle of night more to use the bathroom but hasn't complained.  Has not yet received Xeloda but will call us when received to make sure he gets started on this correct day.  No further questions.

## 2012-10-02 NOTE — Telephone Encounter (Signed)
Message copied by Augusto Garbe on Mon Oct 02, 2012  5:04 PM ------      Message from: Lorri Frederick      Created: Fri Sep 29, 2012  2:04 PM      Regarding: 1st chemo      Contact: [phone]       Wife cell - [phone].  oxaliplatin ------

## 2012-10-06 ENCOUNTER — Telehealth: Payer: Self-pay | Admitting: *Deleted

## 2012-10-06 NOTE — Telephone Encounter (Signed)
Faxed notification that Xeloda was shipped on 6/26 for next day delivery.

## 2012-10-06 NOTE — Telephone Encounter (Signed)
Spoke with patient's wife, Waynetta Sandy, by phone.  She stated that the Xeloda has not arrived and that the drug company called and said it would be arriving tomorrow, 10/07/12.  Dr. Truett Perna was informed.  Per Dr. Truett Perna, "patient is to take Xeloda for 7 days beginning 6/28-10/13/12".  Patient's wife was informed and she verbalized understanding and acknowledged patient to only take Xeloda for 7 days this cycle.  She had no further questions and understands to call [phone] for any problems.

## 2012-10-18 ENCOUNTER — Ambulatory Visit (INDEPENDENT_AMBULATORY_CARE_PROVIDER_SITE_OTHER): Payer: BC Managed Care – PPO | Admitting: Surgery

## 2012-10-18 ENCOUNTER — Encounter (INDEPENDENT_AMBULATORY_CARE_PROVIDER_SITE_OTHER): Payer: Self-pay | Admitting: Surgery

## 2012-10-18 VITALS — BP 130/92 | HR 66 | Resp 16 | Ht 67.0 in | Wt 166.8 lb

## 2012-10-18 DIAGNOSIS — C184 Malignant neoplasm of transverse colon: Secondary | ICD-10-CM

## 2012-10-18 NOTE — Progress Notes (Signed)
Subjective:     Patient ID: Jason Bray, male   DOB: [birth year], 57 y.o.   MRN: [id number]  HPI  Jason Bray  June 05, 1955 [id number]  Patient Care Team: Jason Boys, MD as PCP - General (Family Medicine) Jason Friar, MD as Consulting Physician (Gastroenterology) Jason Sportsman, MD as Consulting Physician (General Surgery) Jason Stade, MD as Consulting Physician (Cardiology)  This patient is a 57 y.o.male who presents today for surgical evaluation s/p surgery 08/25/2012  POST-OPERATIVE DIAGNOSIS: cancer hepatic flexure and polyp mid transverse colon  PROCEDURE: Procedure(s):  LAPAROSCOPIC PARTIAL COLECTOMY  SURGEON: Surgeon(s):  Jason Sportsman, MD  Jason Levee, MD - Asst   Patient comes today feeling well.  He coughed all 18 holes last week without problems.  Having daily bowel movements.  No fevers or chills.  Energy level getting better.  He is seen He see medical oncology.  Porta catheter was placed.  They have started chemotherapy.  Due to get his second course intravenously soon.  No complaints of neck or shoulder soreness  Patient Active Problem List   Diagnosis Date Noted  . 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 Adenomatous colon polyp - prox descending colon 08/09/2012  . HYPERLIPIDEMIA 07/09/2009  . ANXIETY 07/09/2009  . CHEST PAIN 07/09/2009    Past Medical History  Diagnosis Date  . Hyperlipidemia   . Anxiety   . GERD (gastroesophageal reflux disease)   . Cancer     cancerous colon polyp  . Anemia     Past Surgical History  Procedure Laterality Date  . Cardiac catheterization  06/2009    non cardiac  . Colonoscopy    . Laparoscopic partial colectomy N/A 08/25/2012    Procedure: LAPAROSCOPIC PARTIAL COLECTOMY  ;  Surgeon: Jason Sportsman, MD;  Location: [facility];  Service: General;  Laterality: N/A;  . Portacath placement N/A 09/22/2012    Procedure: INSERTION PORT-A-CATH UNDER ULTRASOUND &  FLUROSCOPIC GUIDANCE;  Surgeon: Jason Sportsman, MD;  Location: [facility];  Service: General;  Laterality: N/A;    History   Social History  . Marital Status: Married    Spouse Name: Jason Bray    Number of Children: 3  . Years of Education: N/A   Occupational History  . Emergency planning/management officer    Social History Main Topics  . Smoking status: Former Smoker -- 1.00 packs/day for 20 years    Types: Cigarettes    Quit date: 01/19/2007  . Smokeless tobacco: Former Neurosurgeon    Types: Chew    Quit date: 01/18/1998  . Alcohol Use: 0.0 oz/week     Comment: weekly   . Drug Use: No  . Sexually Active: Not on file   Other Topics Concern  . 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 Lymphoma Mother   . Hypertension Father   . Hyperlipidemia Father   . Hypertension Brother     Current Outpatient Prescriptions  Medication Sig Dispense Refill  . aspirin 325 MG tablet Take 325 mg by mouth 3 (three) times a week.       . capecitabine (XELODA) 500 MG tablet Take 3 tablets (1,500 mg total) by mouth 2 (two) times daily after a meal. X  14 days, 7 days rest Start on 10/20/12  84 tablet  1  . ferrous sulfate 325 (65 FE) MG tablet Take 325 mg by mouth daily with breakfast.      . lidocaine-prilocaine (EMLA) cream Apply topically as needed. Apply 1 teaspoon to PAC site 1-2 hours prior to stick and cover with plastic wrap  30 g  prn  . metoprolol succinate (TOPROL-XL) 50 MG 24 hr tablet Take 50 mg by mouth at bedtime. Take with or immediately following a meal.      . Multiple Vitamins-Iron (MULTIVITAMIN/IRON PO) Take 1 tablet by mouth daily.      . naproxen (NAPROSYN) 500 MG tablet Take 1 tablet (500 mg total) by mouth 2 (two) times daily with a meal.  40 tablet  2  . OVER THE COUNTER MEDICATION Take 1 Package by mouth daily.  Vitamin supplement pack---Mega Men Supplement      . oxyCODONE (OXY IR/ROXICODONE) 5 MG immediate release tablet Take 1-2 tablets (5-10 mg total) by mouth every 4 (four) hours as needed for pain.  50 tablet  0  . prochlorperazine (COMPAZINE) 10 MG tablet Take 1 tablet (10 mg total) by mouth every 6 (six) hours as needed (for nausea).  60 tablet  1  . simvastatin (ZOCOR) 20 MG tablet Take 20 mg by mouth at bedtime.       No current facility-administered medications for this visit.     No Known Allergies  There were no vitals taken for this visit.  Dg Chest Port 1 View  09/22/2012   *RADIOLOGY REPORT*  Clinical Data: The Port-A-Cath placement.  PORTABLE CHEST - 1 VIEW  Comparison: The chest radiograph 06/17/2009  Findings: Interval placement of a right power port with the tip 6.5 cm below the carina at the level of the cavoatrial junction.  No evidence of pneumothorax.  No pleural fluid.  No pulmonary edema. Cardiac silhouettes is normal  IMPRESSION: Interval placement of a right power port with the tip at or just below the cavoatrial junction.  Findings discussed with Dr. Michaell Bray on 09/22/2012 at 1400 hours .   Original Report Authenticated By: Jason Bray, M.D.   Dg C-arm 1-60 Min-no Report  09/22/2012   CLINICAL DATA: portacath   C-ARM 1-60 MINUTES  Fluoroscopy was utilized by the requesting physician.  No radiographic  interpretation.     Diagnosis Colon, segmental resection for tumor, proximal - INVASIVE MODERATELY DIFFERENTIATED ADENOCARCINOMA, INVADING THROUGH THE MUSCULARIS PROPRIA INTO PERICOLONIC FATTY TISSUE; NO EVIDENCE OF ANGIOLYMPHATIC INVASION IDENTIFIED. - A LARGE TUBULAR ADENOMA WITH NO EVIDENCE OF HIGH GRADE DYSPLASIA OR MALIGNANCY (1.7 CM). - ONE OF TWENTY-EIGHT LYMPH NODES, POSITIVE FOR METASTATIC CARCINOMA WITH EXTRANODAL EXTENSION (1/28) - RESECTION MARGINS, NEGATIVE FOR MALIGNANCY. 1 of 3 FINAL for Jason Bray ([id number]) Microscopic Comment COLON AND  RECTUM Specimen: Proximal colon Procedure: Segmental resection Tumor site: Transverse colon Specimen integrity: Intact Macroscopic intactness of mesorectum: N/A Macroscopic tumor perforation: No Invasive tumor: Maximum size: 6.0 cm, Erland Vivas measurement Histologic type(s): Invasive adenocarcinoma Histologic grade and differentiation: G2: moderately differentiated/low grade Type of polyp in which invasive carcinoma arose: N/A Microscopic extension of invasive tumor: Invading through the muscularis propria into pericolonic fatty tissue. Lymph-Vascular invasion: Not identified Peri-neural invasion: Not identified Tumor deposit(s) (discontinuous extramural extension): N/A Resection margins: Negative Proximal margin: 20 cm Distal margin: 21 cm Circumferential (radial) (posterior ascending, posterior descending; lateral and posterior mid-rectum; and entire lower 1/3 rectum): 4.5 cm Treatment effect (neoadjuvant therapy): No Number of lymph nodes  examined 28; Number positive 1 Additional polyp(s): A large tubular adenoma (1.7 cm) with no evidence of high grade dysplasia or malignancy Non-neoplastic findings: N/A Pathologic Staging: pT3,pN1a,pMX Ancillary studies: A tumor block will be sent out for MSI testing and an addendum report will follow. (HCL:kh 08/29/18) Abigail Miyamoto MD Pathologist, Electronic Signature (Case signed 08/28/2012) Specimen Yandell Mcjunkins and Clinical Information Specimen(s) Obtained: Colon, segmental resection for tumor, proximal Specimen Clinical Information cancer hepatic flexor polyp mid transverse colon (kp) Lauraann Missey Specimen: Proximal colon, received in formalin Specimen integrity: Intact Specimen length: The specimen consists of 10 cm of terminal ileum and 35 cm of colon Tumor location: The tumor is located in the ascending colon and is circumferential. Tumor size: The tumor consists of a 6 x 3.8 cm sessile, ulcerated mass which has a maximum thickness of  1 cm. Percent of bowel circumference involved: 100% Tumor distance to margins: Proximal: 20 cm Distal: 21 cm Radial (posterior ascending, posterior descending; lateral and posterior mid-rectum; and entire lower 1/3 rectum): 4.5 cm 2 of 3 FINAL for MYLO, CHOI ([id number]) Jaysin Gayler(continued) Macroscopic extent of tumor invasion: Tumor invades through the muscularis propria into the subserosal adipose tissue but does not involve the serosa. Total presumed lymph nodes: There are 26 rubbery tan red ovoid nodules tentatively identified as lymph nodes varying in size from 0.3 to 0.8 cm in greatest dimensions. Extramural satellite tumor nodules: Not grossly identified. Mucosal polyp(s): 5 cm from the distal margin there is a 1.7 x 1.5 x 1 cm sessile, rubbery mucosal polyp. Additional findings: The uninvolved mucosa is glistening and tan. The appendix is present and measures 4.2 cm in length x 0.6 cm in diameter. Block summary: Fifteen blocks submitted A = proximal margin B = distal margin C = proximal tumor to uninvolved mucosa transition D = distal tumor to uninvolved mucosa transition E = deep tumor extension F = tumor G = tissue for molecular testing H, I = mucosal polyp J = appendix K-O = lymph nodes (GRP:kh 08-28-12) Report signed out from the following location(s) [facility] [address],[place] [address].CLIA:[id number],CAP:[id number]., [facility] [address], [place], [place] [address]. CLIA #: [id number], 3 of    Assessment:                 Review of Systems  Constitutional: Negative for fever, chills and diaphoresis.  HENT: Negative for nosebleeds, sore throat, facial swelling, mouth sores, trouble swallowing and ear discharge.   Eyes: Negative for photophobia, discharge and visual disturbance.  Respiratory: Negative for choking, chest tightness, shortness of breath and stridor.   Cardiovascular: Negative for  chest pain and palpitations.  Gastrointestinal: Negative for nausea, vomiting, abdominal pain, diarrhea, constipation, blood in stool, abdominal distention, anal bleeding and rectal pain.  Endocrine: Negative for cold intolerance and heat intolerance.  Genitourinary: Negative for dysuria, urgency, difficulty urinating and testicular pain.  Musculoskeletal: Negative for myalgias, back pain, arthralgias and gait problem.  Skin: Negative for color change, pallor, rash and wound.  Allergic/Immunologic: Negative for environmental allergies and food allergies.  Neurological: Negative for dizziness, speech difficulty, weakness, numbness and headaches.  Hematological: Negative for adenopathy. Does not bruise/bleed easily.  Psychiatric/Behavioral: Negative for hallucinations, confusion and agitation.       Objective:   Physical Exam  Constitutional: He is oriented to person, place, and time. He appears well-developed and well-nourished. No distress.  HENT:  Head: Normocephalic.  Mouth/Throat: Oropharynx is clear and moist. No oropharyngeal exudate.  Eyes: Conjunctivae and  EOM are normal. Pupils are equal, round, and reactive to light. No scleral icterus.  Neck: Normal range of motion. Neck supple. No tracheal deviation present.  Cardiovascular: Normal rate, regular rhythm and intact distal pulses.   Pulmonary/Chest: Effort normal and breath sounds normal. No respiratory distress.    Abdominal: Soft. He exhibits no distension. There is no tenderness. Hernia confirmed negative in the right inguinal area and confirmed negative in the left inguinal area.  Musculoskeletal: Normal range of motion. He exhibits no tenderness.  Lymphadenopathy:    He has no cervical adenopathy.       Right: No inguinal adenopathy present.       Left: No inguinal adenopathy present.  Neurological: He is alert and oriented to person, place, and time. No cranial nerve deficit. He exhibits normal muscle tone. Coordination  normal.  Skin: Skin is warm and dry. No rash noted. He is not diaphoretic. No erythema. No pallor.  Psychiatric: He has a normal mood and affect. His behavior is normal. Judgment and thought content normal.         Assessment:     Recovering well status post resection for node-positive cancer Of hepatic flexure, recovering well 7 weeks post-op      Plan:     Regular activity.  Low impact exercise such as walking an hour a day at least ideal.  Okay for swimming.  Recommend showering after use of private/public pool or swimming in ocean/lake.  Do not push through pain.  Diet as tolerated.  Low fat high fiber diet ideal.  Bowel regimen with 30 g fiber a day and fiber supplement as needed to avoid problems.  One-year followup colonoscopy to rule out new polyps/cancers.  Return to clinic as needed.   Instructions discussed.  Followup with primary care physician for other health issues as would normally be done.  Questions answered.  The patient expressed understanding and appreciation

## 2012-10-18 NOTE — Patient Instructions (Addendum)
GETTING TO GOOD BOWEL HEALTH. Irregular bowel habits such as constipation and diarrhea can lead to many problems over time.  Having one soft bowel movement a day is the most important way to prevent further problems.  The anorectal canal is designed to handle stretching and feces to safely manage our ability to get rid of solid waste (feces, poop, stool) out of our body.  BUT, hard constipated stools can act like ripping concrete bricks and diarrhea can be a burning fire to this very sensitive area of our body, causing inflamed hemorrhoids, anal fissures, increasing risk is perirectal abscesses, abdominal pain/bloating, an making irritable bowel worse.     The goal: ONE SOFT BOWEL MOVEMENT A DAY!  To have soft, regular bowel movements:    Drink at least 8 tall glasses of water a day.     Take plenty of fiber.  Fiber is the undigested part of plant food that passes into the colon, acting s "natures broom" to encourage bowel motility and movement.  Fiber can absorb and hold large amounts of water. This results in a larger, bulkier stool, which is soft and easier to pass. Work gradually over several weeks up to 6 servings a day of fiber (25g a day even more if needed) in the form of: o Vegetables -- Root (potatoes, carrots, turnips), leafy green (lettuce, salad greens, celery, spinach), or cooked high residue (cabbage, broccoli, etc) o Fruit -- Fresh (unpeeled skin & pulp), Dried (prunes, apricots, cherries, etc ),  or stewed ( applesauce)  o Whole grain breads, pasta, etc (whole wheat)  o Bran cereals    Bulking Agents -- This type of water-retaining fiber generally is easily obtained each day by one of the following:  o Psyllium bran -- The psyllium plant is remarkable because its ground seeds can retain so much water. This product is available as Metamucil, Konsyl, Effersyllium, Per Diem Fiber, or the less expensive generic preparation in drug and health food stores. Although labeled a laxative, it really  is not a laxative.  o Methylcellulose -- This is another fiber derived from wood which also retains water. It is available as Citrucel. o Polyethylene Glycol - and "artificial" fiber commonly called Miralax or Glycolax.  It is helpful for people with gassy or bloated feelings with regular fiber o Flax Seed - a less gassy fiber than psyllium   No reading or other relaxing activity while on the toilet. If bowel movements take longer than 5 minutes, you are too constipated   AVOID CONSTIPATION.  High fiber and water intake usually takes care of this.  Sometimes a laxative is needed to stimulate more frequent bowel movements, but    Laxatives are not a good long-term solution as it can wear the colon out. o Osmotics (Milk of Magnesia, Fleets phosphosoda, Magnesium citrate, MiraLax, GoLytely) are safer than  o Stimulants (Senokot, Castor Oil, Dulcolax, Ex Lax)    o Do not take laxatives for more than 7days in a row.    IF SEVERELY CONSTIPATED, try a Bowel Retraining Program: o Do not use laxatives.  o Eat a diet high in roughage, such as bran cereals and leafy vegetables.  o Drink six (6) ounces of prune or apricot juice each morning.  o Eat two (2) large servings of stewed fruit each day.  o Take one (1) heaping tablespoon of a psyllium-based bulking agent twice a day. Use sugar-free sweetener when possible to avoid excessive calories.  o Eat a normal breakfast.  o   Set aside 15 minutes after breakfast to sit on the toilet, but do not strain to have a bowel movement.  o If you do not have a bowel movement by the third day, use an enema and repeat the above steps.    Controlling diarrhea o Switch to liquids and simpler foods for a few days to avoid stressing your intestines further. o Avoid dairy products (especially milk & ice cream) for a short time.  The intestines often can lose the ability to digest lactose when stressed. o Avoid foods that cause gassiness or bloating.  Typical foods include  beans and other legumes, cabbage, broccoli, and dairy foods.  Every person has some sensitivity to other foods, so listen to our body and avoid those foods that trigger problems for you. o Adding fiber (Citrucel, Metamucil, psyllium, Miralax) gradually can help thicken stools by absorbing excess fluid and retrain the intestines to act more normally.  Slowly increase the dose over a few weeks.  Too much fiber too soon can backfire and cause cramping & bloating. o Probiotics (such as active yogurt, Align, etc) may help repopulate the intestines and colon with normal bacteria and calm down a sensitive digestive tract.  Most studies show it to be of mild help, though, and such products can be costly. o Medicines:   Bismuth subsalicylate (ex. Kayopectate, Pepto Bismol) every 30 minutes for up to 6 doses can help control diarrhea.  Avoid if pregnant.   Loperamide (Immodium) can slow down diarrhea.  Start with two tablets (4mg  total) first and then try one tablet every 6 hours.  Avoid if you are having fevers or severe pain.  If you are not better or start feeling worse, stop all medicines and call your doctor for advice o Call your doctor if you are getting worse or not better.  Sometimes further testing (cultures, endoscopy, X-ray studies, bloodwork, etc) may be needed to help diagnose and treat the cause of the diarrhea.  Colorectal Cancer Colorectal cancer is an abnormal growth of tissue (tumor) in the colon or rectum that is cancerous (malignant). Unlike noncancerous (benign) tumors, malignant tumors can spread to other parts of your body. The colon is the large bowel or large intestine. The rectum is the last several inches of the colon. CAUSES  The exact cause of colon cancer is unknown.  RISK FACTORS The majority of patients do not have identifiable risk factors, but the following factors may increase your chances of getting colon cancer:  Age. Most colorectal cancers occur in people older than 50  years.  Having abnormal growths (polyps) on the inner wall of the colon or rectum.  Diabetes.  Being African American.  Family history of hereditary nonpolyposis colon cancer. This condition is caused by changes in the genes that are responsible for repairing mismatched DNA.  Family history of familial adenomatous polyposis (FAP). This is a rare, inherited condition in which hundreds of polyps form in the colon and rectum. It is caused by a change in the APC gene. Unless FAP is treated, it usually leads to colorectal cancer by age 54.  Personal history of cancer. A person who has already had colorectal cancer may develop it a second time. Also, women with a history of ovarian, uterine, or breast cancer are at a somewhat higher risk of developing colorectal cancer.  Inflammatory bowel disease, including ulcerative colitis and Crohn's disease.  Being obese or eating a diet that is high in fat (especially animal fat) and low in fiber,  fruits, and vegetables.  Smoking. A person who smokes cigarettes may be at increased risk of developing polyps and colorectal cancer.  Heavy alcohol use. SYMPTOMS Early colorectal cancer often does not cause symptoms. As the cancer grows, symptoms may include:  Diarrhea.  Constipation.  Feeling like the bowel does not empty completely after a bowel movement.  Blood in the stool.  Stools that are narrower than usual.  Abdominal discomfort, pain, bloating, fullness, or cramps.  Unexplained weight loss.  Constant tiredness.  Nausea and vomiting. DIAGNOSIS  Your caregiver will ask about your medical history. He or she may also perform a number of procedures, such as:  A physical exam.  Blood tests. This may include a routine complete blood count and iron level testing. Your caregiver may also check for carcinoembryonic antigen (CEA) and other substances in the blood. Some people who have colorectal cancer have a high CEA level. These levels may be  used to follow the activity of your colon cancer.  Chest X-rays, computed tomography (CT) scans, or magnetic resonance imaging (MRI).  Taking a tissue sample (biopsy) from the colon or rectum. The sample is examined under a microscope to look for cancer cells.  Sigmoidoscopy. With this test, your caregiver can see inside your colon. A thin flexible tube (sigmoidoscope) is placed into your rectum. This device has a light source and a tiny video camera in it. Your caregiver uses the sigmoidoscope to look at the last third of your colon.  Colonoscopy. This test is like sigmoidoscopy, but your caregiver looks at the entire colon. This test usually requires medicine that helps you relax (sedative).  Endorectal ultrasound. With this test, your caregiver can see how deep a rectal tumor has grown and whether the cancer has spread to lymph nodes or other nearby tissues. A tool (probe) is inserted into the rectum. The probe sends out sound waves to the rectum and nearby tissues, and a computer uses the echoes to create a picture. Your cancer will be staged to determine its severity and extent. Staging is a careful attempt to find out the size of the tumor, whether the cancer has spread, and if so, to what parts of the body. You may need to have more tests to determine the stage of your cancer. The test results will help determine what treatment plan is best for you. STAGES  Stage 0. The cancer is found only in the innermost lining of the colon or rectum.  Stage I. The cancer has grown into the inner wall of the colon or rectum. The cancer has not yet reached the outer wall of the colon.  Stage II. The cancer extends more deeply into or through the wall of the colon or rectum. It may have invaded nearby tissue, but cancer cells have not spread to the lymph nodes.  Stage III. The cancer has spread to nearby lymph nodes but not to other parts of the body.  Stage IV. The cancer has spread to other parts of  the body, such as the liver or lungs. Your caregiver may tell you the detailed stage of your cancer. In that case, the stage will include both a number and a letter. TREATMENT  Depending on the type and stage, colorectal cancer may be treated with surgery, radiation therapy, or chemotherapy. Some patients have a combination of these therapies.  Surgery may be done to remove the polyps from your colon. In early stages, your caregiver may be able to do this during a colonoscopy.  In later stages, surgery may be done to remove part of your colon.  Radiation therapy uses high-energy rays to kill cancer cells. This is usually recommended for patients with rectal cancer.  Chemotherapy is the use of drugs to kill cancer cells. Caregivers also give chemotherapy to help reduce pain and other problems caused by colorectal cancer. This may be done even if the cancer is not curable. HOME CARE INSTRUCTIONS   Only take over-the-counter or prescription medicines for pain, discomfort, or fever as directed by your caregiver.  Maintain a healthy diet.  Consider joining a support group. This may help you learn to cope with the stress of having colorectal cancer.  Seek advice to help you manage treatment side effects.  Keep all follow-up appointments as directed by your caregiver.  Inform your cancer specialist if you are admitted to the hospital. SEEK IMMEDIATE MEDICAL CARE IF:   Your diarrhea or constipation does not go away.  You have alternating constipation and diarrhea.  You have blood in your stools.  Your abdominal pain gets worse.  You lose weight without trying.  You notice new fatigue or weakness.  You develop a fever during chemotherapy treatment. Document Released: 03/29/2005 Document Revised: 06/21/2011 Document Reviewed: 03/16/2011 [facility] Patient Information 2014 [place], [place].

## 2012-10-19 ENCOUNTER — Ambulatory Visit (HOSPITAL_BASED_OUTPATIENT_CLINIC_OR_DEPARTMENT_OTHER): Payer: BC Managed Care – PPO | Admitting: Oncology

## 2012-10-19 ENCOUNTER — Other Ambulatory Visit (HOSPITAL_BASED_OUTPATIENT_CLINIC_OR_DEPARTMENT_OTHER): Payer: BC Managed Care – PPO | Admitting: Lab

## 2012-10-19 VITALS — BP 153/85 | HR 97 | Temp 98.0°F | Resp 18 | Ht 67.0 in | Wt 172.3 lb

## 2012-10-19 DIAGNOSIS — C184 Malignant neoplasm of transverse colon: Secondary | ICD-10-CM

## 2012-10-19 LAB — COMPREHENSIVE METABOLIC PANEL (CC13)
ALT: 43 U/L (ref 0–55)
AST: 38 U/L — ABNORMAL HIGH (ref 5–34)
CO2: 29 mEq/L (ref 22–29)
Calcium: 9.7 mg/dL (ref 8.4–10.4)
Chloride: 104 mEq/L (ref 98–109)
Sodium: 144 mEq/L (ref 136–145)
Total Protein: 7.9 g/dL (ref 6.4–8.3)

## 2012-10-19 LAB — CBC WITH DIFFERENTIAL/PLATELET
BASO%: 0.8 % (ref 0.0–2.0)
EOS%: 1.7 % (ref 0.0–7.0)
MCH: 24.7 pg — ABNORMAL LOW (ref 27.2–33.4)
MCHC: 32.4 g/dL (ref 32.0–36.0)
MONO#: 1 10*3/uL — ABNORMAL HIGH (ref 0.1–0.9)
RBC: 5.17 10*6/uL (ref 4.20–5.82)
RDW: 32 % — ABNORMAL HIGH (ref 11.0–14.6)
WBC: 7 10*3/uL (ref 4.0–10.3)
lymph#: 2.1 10*3/uL (ref 0.9–3.3)

## 2012-10-19 NOTE — Progress Notes (Signed)
   [facility]    OFFICE PROGRESS NOTE   INTERVAL HISTORY:   He returns as scheduled. He completed a first cycle of CAPOX on 09/29/2012. The Xeloda arrived late and he took only one week. He reports cold sensitivity lasting for approximately 1 week after chemotherapy. This has resolved. He feels well at present. No mouth sores, nausea, or diarrhea.  Objective:  Vital signs in last 24 hours:  Blood pressure 153/85, pulse 97, temperature 98 F (36.7 C), temperature source Oral, resp. rate 18, height 5\' 7"  (1.702 m), weight 172 lb 4.8 oz (78.155 kg).    HEENT: No thrush or ulcers Resp: Lungs clear bilaterally Cardio: Regular rate and rhythm GI: No hepatomegaly, nontender Vascular: No leg edema  Skin: Palms without erythema   Portacath/PICC-without erythema  Lab Results:  Lab Results  Component Value Date   WBC 7.0 10/19/2012   HGB 12.8* 10/19/2012   HCT 39.4 10/19/2012   MCV 76.2* 10/19/2012   PLT 448* 10/19/2012   ANC 3.7    Medications: I have reviewed the patient's current medications.  Assessment/Plan: 1.Stage III (T3 N1) adenocarcinoma of the ascending colon, status post a partial colectomy on 08/25/2012. The tumor is microsatellite stable and there was no loss of mismatch repair protein expression.  -Cycle 1 of adjuvant CAPOX 09/29/2012 2. Transverse colon polyp, removed in the colectomy specimen 08/25/2012  3. Iron deficiency anemia-taking iron, improved  Disposition:  He tolerated the first cycle of CAPOX well.  He will complete the full 14 days of Xeloda with cycle 2 beginning on 10/20/2012. Mr.  Dowson will return for an office visit prior to cycle 3 on 11/10/2012.  [name], MD  10/19/2012  4:15 PM

## 2012-10-20 ENCOUNTER — Ambulatory Visit (HOSPITAL_BASED_OUTPATIENT_CLINIC_OR_DEPARTMENT_OTHER): Payer: BC Managed Care – PPO

## 2012-10-20 ENCOUNTER — Telehealth: Payer: Self-pay | Admitting: *Deleted

## 2012-10-20 VITALS — BP 136/86 | HR 83 | Temp 98.1°F | Resp 20

## 2012-10-20 DIAGNOSIS — Z5111 Encounter for antineoplastic chemotherapy: Secondary | ICD-10-CM

## 2012-10-20 DIAGNOSIS — C184 Malignant neoplasm of transverse colon: Secondary | ICD-10-CM

## 2012-10-20 MED ORDER — DEXTROSE 5 % IV SOLN
Freq: Once | INTRAVENOUS | Status: AC
  Administered 2012-10-20: 09:00:00 via INTRAVENOUS

## 2012-10-20 MED ORDER — OXALIPLATIN CHEMO INJECTION 100 MG/20ML
130.0000 mg/m2 | Freq: Once | INTRAVENOUS | Status: AC
  Administered 2012-10-20: 245 mg via INTRAVENOUS
  Filled 2012-10-20: qty 49

## 2012-10-20 MED ORDER — HEPARIN SOD (PORK) LOCK FLUSH 100 UNIT/ML IV SOLN
500.0000 [IU] | Freq: Once | INTRAVENOUS | Status: AC | PRN
  Administered 2012-10-20: 500 [IU]
  Filled 2012-10-20: qty 5

## 2012-10-20 MED ORDER — ONDANSETRON 8 MG/50ML IVPB (CHCC)
8.0000 mg | Freq: Once | INTRAVENOUS | Status: AC
  Administered 2012-10-20: 8 mg via INTRAVENOUS

## 2012-10-20 MED ORDER — SODIUM CHLORIDE 0.9 % IJ SOLN
10.0000 mL | INTRAMUSCULAR | Status: DC | PRN
  Administered 2012-10-20: 10 mL
  Filled 2012-10-20: qty 10

## 2012-10-20 MED ORDER — DEXAMETHASONE SODIUM PHOSPHATE 10 MG/ML IJ SOLN
10.0000 mg | Freq: Once | INTRAMUSCULAR | Status: AC
  Administered 2012-10-20: 10 mg via INTRAVENOUS

## 2012-10-20 NOTE — Telephone Encounter (Signed)
sw pt gv apts for 11/10/12 labs @ 9:15am w/ ob @9 :45am, and tx to follow. i also gv appts for 12/01/12 labs @ 10:30am, ov @ 11am, and tx to follow. i emailed MW to add the pt tx. Pt is aware that i will mail a letter/avs as well...td

## 2012-10-20 NOTE — Telephone Encounter (Signed)
Per staff message and POF I have scheduled appts.  JMW  

## 2012-10-20 NOTE — Patient Instructions (Signed)
[facility] Discharge Instructions for Patients Receiving Chemotherapy  Today you received the following chemotherapy agents Oxaliplatin  To help prevent nausea and vomiting after your treatment, we encourage you to take your nausea medication as needed   If you develop nausea and vomiting that is not controlled by your nausea medication, call the clinic.   BELOW ARE SYMPTOMS THAT SHOULD BE REPORTED IMMEDIATELY:  *FEVER GREATER THAN 100.5 F  *CHILLS WITH OR WITHOUT FEVER  NAUSEA AND VOMITING THAT IS NOT CONTROLLED WITH YOUR NAUSEA MEDICATION  *UNUSUAL SHORTNESS OF BREATH  *UNUSUAL BRUISING OR BLEEDING  TENDERNESS IN MOUTH AND THROAT WITH OR WITHOUT PRESENCE OF ULCERS  *URINARY PROBLEMS  *BOWEL PROBLEMS  UNUSUAL RASH Items with * indicate a potential emergency and should be followed up as soon as possible.  Feel free to call the clinic you have any questions or concerns. The clinic phone number is [phone].

## 2012-10-25 ENCOUNTER — Encounter: Payer: Self-pay | Admitting: *Deleted

## 2012-10-25 NOTE — Progress Notes (Signed)
Faxed confirmation from [facility] that Capecitabine was shipped 10/24/12 for next day delivery.

## 2012-11-02 ENCOUNTER — Other Ambulatory Visit: Payer: Self-pay | Admitting: Oncology

## 2012-11-09 ENCOUNTER — Telehealth: Payer: Self-pay | Admitting: *Deleted

## 2012-11-09 NOTE — Telephone Encounter (Signed)
[facility] faxed Capcitabine 500 mg tablet refill request.  Request to provider for review.

## 2012-11-10 ENCOUNTER — Other Ambulatory Visit (HOSPITAL_BASED_OUTPATIENT_CLINIC_OR_DEPARTMENT_OTHER): Payer: BC Managed Care – PPO | Admitting: Lab

## 2012-11-10 ENCOUNTER — Ambulatory Visit (HOSPITAL_BASED_OUTPATIENT_CLINIC_OR_DEPARTMENT_OTHER): Payer: BC Managed Care – PPO

## 2012-11-10 ENCOUNTER — Ambulatory Visit (HOSPITAL_BASED_OUTPATIENT_CLINIC_OR_DEPARTMENT_OTHER): Payer: BC Managed Care – PPO | Admitting: Nurse Practitioner

## 2012-11-10 ENCOUNTER — Telehealth: Payer: Self-pay | Admitting: Oncology

## 2012-11-10 VITALS — BP 145/88 | HR 80 | Temp 97.0°F | Resp 18 | Ht 67.0 in | Wt 169.0 lb

## 2012-11-10 DIAGNOSIS — C182 Malignant neoplasm of ascending colon: Secondary | ICD-10-CM

## 2012-11-10 DIAGNOSIS — C184 Malignant neoplasm of transverse colon: Secondary | ICD-10-CM

## 2012-11-10 DIAGNOSIS — Z5111 Encounter for antineoplastic chemotherapy: Secondary | ICD-10-CM

## 2012-11-10 DIAGNOSIS — D509 Iron deficiency anemia, unspecified: Secondary | ICD-10-CM

## 2012-11-10 LAB — COMPREHENSIVE METABOLIC PANEL (CC13)
ALT: 102 U/L — ABNORMAL HIGH (ref 0–55)
AST: 69 U/L — ABNORMAL HIGH (ref 5–34)
Albumin: 4.1 g/dL (ref 3.5–5.0)
Calcium: 9.7 mg/dL (ref 8.4–10.4)
Chloride: 104 mEq/L (ref 98–109)
Potassium: 4.3 mEq/L (ref 3.5–5.1)
Total Protein: 8.2 g/dL (ref 6.4–8.3)

## 2012-11-10 LAB — CBC WITH DIFFERENTIAL/PLATELET
BASO%: 1.1 % (ref 0.0–2.0)
EOS%: 2.8 % (ref 0.0–7.0)
HGB: 15 g/dL (ref 13.0–17.1)
MCH: 26.4 pg — ABNORMAL LOW (ref 27.2–33.4)
MCHC: 33 g/dL (ref 32.0–36.0)
MONO#: 0.8 10*3/uL (ref 0.1–0.9)
RDW: 34.8 % — ABNORMAL HIGH (ref 11.0–14.6)
WBC: 5 10*3/uL (ref 4.0–10.3)
lymph#: 1.6 10*3/uL (ref 0.9–3.3)

## 2012-11-10 MED ORDER — SODIUM CHLORIDE 0.9 % IJ SOLN
10.0000 mL | INTRAMUSCULAR | Status: DC | PRN
  Administered 2012-11-10: 10 mL
  Filled 2012-11-10: qty 10

## 2012-11-10 MED ORDER — HEPARIN SOD (PORK) LOCK FLUSH 100 UNIT/ML IV SOLN
500.0000 [IU] | Freq: Once | INTRAVENOUS | Status: AC | PRN
  Administered 2012-11-10: 500 [IU]
  Filled 2012-11-10: qty 5

## 2012-11-10 MED ORDER — ONDANSETRON 8 MG/50ML IVPB (CHCC)
8.0000 mg | Freq: Once | INTRAVENOUS | Status: AC
  Administered 2012-11-10: 8 mg via INTRAVENOUS

## 2012-11-10 MED ORDER — OXALIPLATIN CHEMO INJECTION 100 MG/20ML
130.0000 mg/m2 | Freq: Once | INTRAVENOUS | Status: AC
  Administered 2012-11-10: 245 mg via INTRAVENOUS
  Filled 2012-11-10: qty 49

## 2012-11-10 MED ORDER — DEXTROSE 5 % IV SOLN
Freq: Once | INTRAVENOUS | Status: AC
  Administered 2012-11-10: 11:00:00 via INTRAVENOUS

## 2012-11-10 MED ORDER — DEXAMETHASONE SODIUM PHOSPHATE 10 MG/ML IJ SOLN
10.0000 mg | Freq: Once | INTRAMUSCULAR | Status: AC
  Administered 2012-11-10: 10 mg via INTRAVENOUS

## 2012-11-10 NOTE — Patient Instructions (Addendum)
[facility] Discharge Instructions for Patients Receiving Chemotherapy  Today you received the following chemotherapy agents Oxaliplatin.  To help prevent nausea and vomiting after your treatment, we encourage you to take your nausea medication.   If you develop nausea and vomiting that is not controlled by your nausea medication, call the clinic.   BELOW ARE SYMPTOMS THAT SHOULD BE REPORTED IMMEDIATELY:  *FEVER GREATER THAN 100.5 F  *CHILLS WITH OR WITHOUT FEVER  NAUSEA AND VOMITING THAT IS NOT CONTROLLED WITH YOUR NAUSEA MEDICATION  *UNUSUAL SHORTNESS OF BREATH  *UNUSUAL BRUISING OR BLEEDING  TENDERNESS IN MOUTH AND THROAT WITH OR WITHOUT PRESENCE OF ULCERS  *URINARY PROBLEMS  *BOWEL PROBLEMS  UNUSUAL RASH Items with * indicate a potential emergency and should be followed up as soon as possible.  Feel free to call the clinic you have any questions or concerns. The clinic phone number is [phone].    

## 2012-11-10 NOTE — Progress Notes (Signed)
OFFICE PROGRESS NOTE  Interval history:  Jason Bray returns as scheduled. He completed cycle 2 CAPOX beginning 10/20/2012. He denies nausea/vomiting. No mouth sores. He notes loose stools for a few days during treatment. The cold sensitivity completely resolved by about day 10. He denies persistent neuropathy symptoms. No hand or foot pain or redness. He noted that his calves became "sore" on day 2. The soreness resolved over 2-3 days.  Objective: Blood pressure 145/88, pulse 80, temperature 97 F (36.1 C), temperature source Oral, resp. rate 18, height 5\' 7"  (1.702 m), weight 169 lb (76.658 kg).  No thrush or ulcerations. Lungs clear. Regular cardiac rhythm. Port-A-Cath site is without erythema. Abdomen is soft. No hepatomegaly. Extremities without edema. Calves soft and nontender. Palms without erythema. No skin breakdown. Vibratory sense intact over the fingertips per tuning fork exam.  Lab Results: Lab Results  Component Value Date   WBC 5.0 11/10/2012   HGB 15.0 11/10/2012   HCT 45.5 11/10/2012   MCV 79.9 11/10/2012   PLT 317 11/10/2012    Chemistry:    Chemistry      Component Value Date/Time   NA 142 11/10/2012 0917   NA 140 09/18/2012 1400   K 4.3 11/10/2012 0917   K 4.9 09/18/2012 1400   CL 102 09/18/2012 1400   CO2 26 11/10/2012 0917   CO2 29 09/18/2012 1400   BUN 12.4 11/10/2012 0917   BUN 15 09/18/2012 1400   CREATININE 0.8 11/10/2012 0917   CREATININE 0.76 09/18/2012 1400   CREATININE 0.88 08/11/2012 1603      Component Value Date/Time   CALCIUM 9.7 11/10/2012 0917   CALCIUM 9.9 09/18/2012 1400   ALKPHOS 89 11/10/2012 0917   ALKPHOS 63 06/17/2009 1358   AST 69 Repeated and Verified* 11/10/2012 0917   AST 24 06/17/2009 1358   ALT 102 Repeated and Verified* 11/10/2012 0917   ALT 23 06/17/2009 1358   BILITOT 0.77 11/10/2012 0917   BILITOT 1.1 06/17/2009 1358       Studies/Results: No results found.  Medications: I have reviewed the patient's current medications.  Assessment/Plan:  1. Stage III (T3  N1) adenocarcinoma of the ascending colon status post partial colectomy 08/25/2012. The tumor is microsatellite stable and there was no loss of mismatch repair protein expression.   He began cycle 1 of adjuvant CAPOX on 09/29/2012.  He completed cycle 2 beginning 10/20/2012. 2. Transverse colon polyp removed in the colectomy specimen 08/25/2012. 3. Iron deficiency anemia. He continues oral iron. The hemoglobin and MCV have corrected into normal range.  Disposition-Jason Bray appears stable. Plan to proceed with cycle 3 CAPOX today as scheduled. He will return for a followup visit and cycle 4 in 3 weeks. He will contact the office in the interim with any problems.  Lonna Cobb ANP/GNP-BC

## 2012-11-10 NOTE — Telephone Encounter (Signed)
gv and printed appt sched and avs for pt..emailed MW to add tx. for SEPT

## 2012-11-13 ENCOUNTER — Telehealth: Payer: Self-pay | Admitting: *Deleted

## 2012-11-13 NOTE — Telephone Encounter (Signed)
[facility] faxed Capecitabine refill request.  Request to provider for review.

## 2012-11-17 ENCOUNTER — Other Ambulatory Visit: Payer: Self-pay | Admitting: *Deleted

## 2012-11-17 DIAGNOSIS — C184 Malignant neoplasm of transverse colon: Secondary | ICD-10-CM

## 2012-11-17 MED ORDER — CAPECITABINE 500 MG PO TABS: 1500.0000 mg | ORAL_TABLET | Freq: Two times a day (BID) | ORAL | Status: DC

## 2012-11-20 ENCOUNTER — Telehealth: Payer: Self-pay | Admitting: *Deleted

## 2012-11-20 NOTE — Telephone Encounter (Signed)
Per staff message and POF I have scheduled appts.  JMW  

## 2012-11-30 ENCOUNTER — Other Ambulatory Visit: Payer: Self-pay | Admitting: Oncology

## 2012-12-01 ENCOUNTER — Other Ambulatory Visit: Payer: Self-pay | Admitting: Medical Oncology

## 2012-12-01 ENCOUNTER — Other Ambulatory Visit: Payer: Self-pay | Admitting: *Deleted

## 2012-12-01 ENCOUNTER — Telehealth: Payer: Self-pay | Admitting: *Deleted

## 2012-12-01 ENCOUNTER — Telehealth: Payer: Self-pay

## 2012-12-01 ENCOUNTER — Ambulatory Visit (HOSPITAL_BASED_OUTPATIENT_CLINIC_OR_DEPARTMENT_OTHER): Payer: BC Managed Care – PPO | Admitting: Oncology

## 2012-12-01 ENCOUNTER — Ambulatory Visit (HOSPITAL_BASED_OUTPATIENT_CLINIC_OR_DEPARTMENT_OTHER): Payer: BC Managed Care – PPO

## 2012-12-01 ENCOUNTER — Other Ambulatory Visit (HOSPITAL_BASED_OUTPATIENT_CLINIC_OR_DEPARTMENT_OTHER): Payer: BC Managed Care – PPO | Admitting: Lab

## 2012-12-01 VITALS — BP 142/89 | HR 86 | Temp 97.0°F | Resp 18 | Wt 173.6 lb

## 2012-12-01 DIAGNOSIS — C182 Malignant neoplasm of ascending colon: Secondary | ICD-10-CM

## 2012-12-01 DIAGNOSIS — C184 Malignant neoplasm of transverse colon: Secondary | ICD-10-CM

## 2012-12-01 DIAGNOSIS — R748 Abnormal levels of other serum enzymes: Secondary | ICD-10-CM

## 2012-12-01 DIAGNOSIS — D509 Iron deficiency anemia, unspecified: Secondary | ICD-10-CM

## 2012-12-01 DIAGNOSIS — Z5111 Encounter for antineoplastic chemotherapy: Secondary | ICD-10-CM

## 2012-12-01 LAB — COMPREHENSIVE METABOLIC PANEL (CC13)
ALT: 99 U/L — ABNORMAL HIGH (ref 0–55)
CO2: 22 mEq/L (ref 22–29)
Calcium: 9.6 mg/dL (ref 8.4–10.4)
Chloride: 104 mEq/L (ref 98–109)
Creatinine: 0.8 mg/dL (ref 0.7–1.3)
Glucose: 129 mg/dl (ref 70–140)
Total Bilirubin: 0.71 mg/dL (ref 0.20–1.20)
Total Protein: 8.2 g/dL (ref 6.4–8.3)

## 2012-12-01 LAB — CBC WITH DIFFERENTIAL/PLATELET
BASO%: 1 % (ref 0.0–2.0)
Eosinophils Absolute: 0.1 10*3/uL (ref 0.0–0.5)
HCT: 44.9 % (ref 38.4–49.9)
HGB: 15 g/dL (ref 13.0–17.1)
LYMPH%: 30.3 % (ref 14.0–49.0)
MCHC: 33.5 g/dL (ref 32.0–36.0)
MONO#: 0.6 10*3/uL (ref 0.1–0.9)
NEUT#: 2.8 10*3/uL (ref 1.5–6.5)
NEUT%: 54.7 % (ref 39.0–75.0)
Platelets: 322 10*3/uL (ref 140–400)
WBC: 5.1 10*3/uL (ref 4.0–10.3)
lymph#: 1.6 10*3/uL (ref 0.9–3.3)

## 2012-12-01 MED ORDER — DEXAMETHASONE SODIUM PHOSPHATE 10 MG/ML IJ SOLN
10.0000 mg | Freq: Once | INTRAMUSCULAR | Status: AC
  Administered 2012-12-01: 10 mg via INTRAVENOUS

## 2012-12-01 MED ORDER — OXALIPLATIN CHEMO INJECTION 100 MG/20ML
130.0000 mg/m2 | Freq: Once | INTRAVENOUS | Status: AC
  Administered 2012-12-01: 245 mg via INTRAVENOUS
  Filled 2012-12-01: qty 49

## 2012-12-01 MED ORDER — ONDANSETRON 8 MG/50ML IVPB (CHCC)
8.0000 mg | Freq: Once | INTRAVENOUS | Status: AC
  Administered 2012-12-01: 8 mg via INTRAVENOUS

## 2012-12-01 MED ORDER — CAPECITABINE 500 MG PO TABS: 1500.0000 mg | ORAL_TABLET | Freq: Two times a day (BID) | ORAL | Status: DC

## 2012-12-01 MED ORDER — DEXTROSE 5 % IV SOLN
Freq: Once | INTRAVENOUS | Status: AC
  Administered 2012-12-01: 12:00:00 via INTRAVENOUS

## 2012-12-01 MED ORDER — HEPARIN SOD (PORK) LOCK FLUSH 100 UNIT/ML IV SOLN
500.0000 [IU] | Freq: Once | INTRAVENOUS | Status: AC | PRN
  Administered 2012-12-01: 500 [IU]
  Filled 2012-12-01: qty 5

## 2012-12-01 MED ORDER — SODIUM CHLORIDE 0.9 % IJ SOLN
10.0000 mL | INTRAMUSCULAR | Status: DC | PRN
  Administered 2012-12-01: 10 mL
  Filled 2012-12-01: qty 10

## 2012-12-01 NOTE — Telephone Encounter (Signed)
Per staff message and POF I have scheduled appts.  JMW  

## 2012-12-01 NOTE — Telephone Encounter (Signed)
THIS REFILL REQUEST FOR CAPECITABINE WAS GIVEN TO DR.SHERRILL'S NURSE, TANYA WHITLOCK,RN. 

## 2012-12-01 NOTE — Telephone Encounter (Signed)
gv and printed appt sched and avs forpt....eamield MW to add tx.   °

## 2012-12-01 NOTE — Patient Instructions (Addendum)
Oxaliplatin Injection What is this medicine? OXALIPLATIN (ox AL i PLA tin) is a chemotherapy drug. It targets fast dividing cells, like cancer cells, and causes these cells to die. This medicine is used to treat cancers of the colon and rectum, and many other cancers. This medicine may be used for other purposes; ask your health care provider or pharmacist if you have questions. What should I tell my health care provider before I take this medicine? They need to know if you have any of these conditions: -kidney disease -an unusual or allergic reaction to oxaliplatin, other chemotherapy, other medicines, foods, dyes, or preservatives -pregnant or trying to get pregnant -breast-feeding How should I use this medicine? This drug is given as an infusion into a vein. It is administered in a hospital or clinic by a specially trained health care professional. Talk to your pediatrician regarding the use of this medicine in children. Special care may be needed. Overdosage: If you think you have taken too much of this medicine contact a poison control center or emergency room at once. NOTE: This medicine is only for you. Do not share this medicine with others. What if I miss a dose? It is important not to miss a dose. Call your doctor or health care professional if you are unable to keep an appointment. What may interact with this medicine? -medicines to increase blood counts like filgrastim, pegfilgrastim, sargramostim -probenecid -some antibiotics like amikacin, gentamicin, neomycin, polymyxin B, streptomycin, tobramycin -zalcitabine Talk to your doctor or health care professional before taking any of these medicines: -acetaminophen -aspirin -ibuprofen -ketoprofen -naproxen This list may not describe all possible interactions. Give your health care provider a list of all the medicines, herbs, non-prescription drugs, or dietary supplements you use. Also tell them if you smoke, drink alcohol, or use  illegal drugs. Some items may interact with your medicine. What should I watch for while using this medicine? Your condition will be monitored carefully while you are receiving this medicine. You will need important blood work done while you are taking this medicine. This medicine can make you more sensitive to cold. Do not drink cold drinks or use ice. Cover exposed skin before coming in contact with cold temperatures or cold objects. When out in cold weather wear warm clothing and cover your mouth and nose to warm the air that goes into your lungs. Tell your doctor if you get sensitive to the cold. This drug may make you feel generally unwell. This is not uncommon, as chemotherapy can affect healthy cells as well as cancer cells. Report any side effects. Continue your course of treatment even though you feel ill unless your doctor tells you to stop. In some cases, you may be given additional medicines to help with side effects. Follow all directions for their use. Call your doctor or health care professional for advice if you get a fever, chills or sore throat, or other symptoms of a cold or flu. Do not treat yourself. This drug decreases your body's ability to fight infections. Try to avoid being around people who are sick. This medicine may increase your risk to bruise or bleed. Call your doctor or health care professional if you notice any unusual bleeding. Be careful brushing and flossing your teeth or using a toothpick because you may get an infection or bleed more easily. If you have any dental work done, tell your dentist you are receiving this medicine. Avoid taking products that contain aspirin, acetaminophen, ibuprofen, naproxen, or ketoprofen unless instructed  by your doctor. These medicines may hide a fever. Do not become pregnant while taking this medicine. Women should inform their doctor if they wish to become pregnant or think they might be pregnant. There is a potential for serious side  effects to an unborn child. Talk to your health care professional or pharmacist for more information. Do not breast-feed an infant while taking this medicine. Call your doctor or health care professional if you get diarrhea. Do not treat yourself. What side effects may I notice from receiving this medicine? Side effects that you should report to your doctor or health care professional as soon as possible: -allergic reactions like skin rash, itching or hives, swelling of the face, lips, or tongue -low blood counts - This drug may decrease the number of white blood cells, red blood cells and platelets. You may be at increased risk for infections and bleeding. -signs of infection - fever or chills, cough, sore throat, pain or difficulty passing urine -signs of decreased platelets or bleeding - bruising, pinpoint red spots on the skin, black, tarry stools, nosebleeds -signs of decreased red blood cells - unusually weak or tired, fainting spells, lightheadedness -breathing problems -chest pain, pressure -cough -diarrhea -jaw tightness -mouth sores -nausea and vomiting -pain, swelling, redness or irritation at the injection site -pain, tingling, numbness in the hands or feet -problems with balance, talking, walking -redness, blistering, peeling or loosening of the skin, including inside the mouth -trouble passing urine or change in the amount of urine Side effects that usually do not require medical attention (report to your doctor or health care professional if they continue or are bothersome): -changes in vision -constipation -hair loss -loss of appetite -metallic taste in the mouth or changes in taste -stomach pain This list may not describe all possible side effects. Call your doctor for medical advice about side effects. You may report side effects to FDA at 1-800-FDA-1088. Where should I keep my medicine? This drug is given in a hospital or clinic and will not be stored at home. NOTE:  This sheet is a summary. It may not cover all possible information. If you have questions about this medicine, talk to your doctor, pharmacist, or health care provider.  2012, [employer]. (10/24/2007 5:22:47 PM)

## 2012-12-02 NOTE — Progress Notes (Signed)
   [facility]    OFFICE PROGRESS NOTE   INTERVAL HISTORY:   He returns as scheduled. He completed another cycle of chemotherapy beginning on 11/10/2012. He had cold sensitivity following chemotherapy. No neuropathy symptoms at present. No mouth sores, diarrhea, or hand/foot pain. He reports chronic dry desquamation at the soles predating chemotherapy.  Objective:  Vital signs in last 24 hours:  Blood pressure 142/89, pulse 86, temperature 97 F (36.1 C), temperature source Oral, resp. rate 18, weight 173 lb 9.6 oz (78.744 kg).    HEENT: no thrush or ulcers Resp: lungs clear bilaterally Cardio: regular rate and rhythm GI: no hepatomegaly Vascular: no leg edema  Skin:palms without erythema. Dry desquamation over the soles without erythema. Neurologic: The vibratory sense is intact at the fingertips bilaterally   Portacath/PICC-without erythema  Lab Results:  Lab Results  Component Value Date   WBC 5.1 12/01/2012   HGB 15.0 12/01/2012   HCT 44.9 12/01/2012   MCV 83.3 12/01/2012   PLT 322 12/01/2012  ANC 2.8  AST 71, ALT 99, bilirubin 0.71, alkaline phosphatase 107  Medications: I have reviewed the patient's current medications.  Assessment/Plan: 1. Stage III (T3 N1) adenocarcinoma of the ascending colon status post partial colectomy 08/25/2012. The tumor is microsatellite stable and there was no loss of mismatch repair protein expression.  He began cycle 1 of adjuvant CAPOX on 09/29/2012.  He completed cycle 2 beginning 10/20/2012. Cycle 3 beginning on 11/10/2012 2. Transverse colon polyp removed in the colectomy specimen 08/25/2012. 3. Iron deficiency anemia. He continues oral iron. The hemoglobin and MCV have corrected into normal range. 4. Mild elevation of the liver enzymes-likely related to chemotherapy. He will hold the simvastatin until chemotherapy is completed.  Disposition:  Mr. Jason Bray has completed 3 cycles of CAPOX. He is tolerating the  chemotherapy well. The plan is to proceed with cycle 4 today. He will return for an office visit and chemotherapy in 3 weeks.   [name], MD  12/02/2012  3:03 PM

## 2012-12-07 NOTE — Telephone Encounter (Signed)
RECEIVED A FAX FROM [employer] CONCERNING A CONFIRMATION OF PRESCRIPTION SHIPMENT FOR CAPECITABINE ON 12/05/12.

## 2012-12-17 ENCOUNTER — Other Ambulatory Visit: Payer: Self-pay | Admitting: Oncology

## 2012-12-21 ENCOUNTER — Telehealth: Payer: Self-pay | Admitting: *Deleted

## 2012-12-21 DIAGNOSIS — C184 Malignant neoplasm of transverse colon: Secondary | ICD-10-CM

## 2012-12-21 NOTE — Telephone Encounter (Signed)
THIS REFILL REQUEST FOR CAPECITABINE WAS GIVEN TO DR.SHERRILL'S NURSE, AMY [email or web address].

## 2012-12-22 ENCOUNTER — Other Ambulatory Visit (HOSPITAL_BASED_OUTPATIENT_CLINIC_OR_DEPARTMENT_OTHER): Payer: BC Managed Care – PPO

## 2012-12-22 ENCOUNTER — Telehealth: Payer: Self-pay | Admitting: Oncology

## 2012-12-22 ENCOUNTER — Ambulatory Visit (HOSPITAL_BASED_OUTPATIENT_CLINIC_OR_DEPARTMENT_OTHER): Payer: BC Managed Care – PPO

## 2012-12-22 ENCOUNTER — Ambulatory Visit (HOSPITAL_BASED_OUTPATIENT_CLINIC_OR_DEPARTMENT_OTHER): Payer: BC Managed Care – PPO | Admitting: Nurse Practitioner

## 2012-12-22 VITALS — BP 145/91 | HR 92 | Temp 97.0°F | Resp 18 | Ht 67.0 in | Wt 174.9 lb

## 2012-12-22 DIAGNOSIS — C184 Malignant neoplasm of transverse colon: Secondary | ICD-10-CM

## 2012-12-22 DIAGNOSIS — D509 Iron deficiency anemia, unspecified: Secondary | ICD-10-CM

## 2012-12-22 DIAGNOSIS — C182 Malignant neoplasm of ascending colon: Secondary | ICD-10-CM

## 2012-12-22 DIAGNOSIS — Z5111 Encounter for antineoplastic chemotherapy: Secondary | ICD-10-CM

## 2012-12-22 LAB — COMPREHENSIVE METABOLIC PANEL (CC13)
ALT: 111 U/L — ABNORMAL HIGH (ref 0–55)
AST: 84 U/L — ABNORMAL HIGH (ref 5–34)
Chloride: 103 mEq/L (ref 98–109)
Creatinine: 0.9 mg/dL (ref 0.7–1.3)
Sodium: 142 mEq/L (ref 136–145)
Total Bilirubin: 0.86 mg/dL (ref 0.20–1.20)

## 2012-12-22 LAB — CBC WITH DIFFERENTIAL/PLATELET
BASO%: 1.2 % (ref 0.0–2.0)
EOS%: 2.2 % (ref 0.0–7.0)
HCT: 46.7 % (ref 38.4–49.9)
LYMPH%: 30 % (ref 14.0–49.0)
MCH: 29.9 pg (ref 27.2–33.4)
MCHC: 34.3 g/dL (ref 32.0–36.0)
NEUT%: 50 % (ref 39.0–75.0)
RBC: 5.34 10*6/uL (ref 4.20–5.82)
lymph#: 1.4 10*3/uL (ref 0.9–3.3)

## 2012-12-22 MED ORDER — ONDANSETRON 8 MG/50ML IVPB (CHCC)
8.0000 mg | Freq: Once | INTRAVENOUS | Status: AC
  Administered 2012-12-22: 8 mg via INTRAVENOUS

## 2012-12-22 MED ORDER — SODIUM CHLORIDE 0.9 % IJ SOLN
10.0000 mL | INTRAMUSCULAR | Status: DC | PRN
  Administered 2012-12-22: 10 mL
  Filled 2012-12-22: qty 10

## 2012-12-22 MED ORDER — ONDANSETRON 8 MG/NS 50 ML IVPB
INTRAVENOUS | Status: AC
  Filled 2012-12-22: qty 8

## 2012-12-22 MED ORDER — DEXAMETHASONE SODIUM PHOSPHATE 10 MG/ML IJ SOLN
10.0000 mg | Freq: Once | INTRAMUSCULAR | Status: AC
  Administered 2012-12-22: 10 mg via INTRAVENOUS

## 2012-12-22 MED ORDER — CAPECITABINE 500 MG PO TABS: 1500.0000 mg | ORAL_TABLET | Freq: Two times a day (BID) | ORAL | Status: DC

## 2012-12-22 MED ORDER — DEXTROSE 5 % IV SOLN
Freq: Once | INTRAVENOUS | Status: AC
  Administered 2012-12-22: 10:00:00 via INTRAVENOUS

## 2012-12-22 MED ORDER — HEPARIN SOD (PORK) LOCK FLUSH 100 UNIT/ML IV SOLN
500.0000 [IU] | Freq: Once | INTRAVENOUS | Status: AC | PRN
  Administered 2012-12-22: 500 [IU]
  Filled 2012-12-22: qty 5

## 2012-12-22 MED ORDER — OXALIPLATIN CHEMO INJECTION 100 MG/20ML
130.0000 mg/m2 | Freq: Once | INTRAVENOUS | Status: AC
  Administered 2012-12-22: 245 mg via INTRAVENOUS
  Filled 2012-12-22: qty 49

## 2012-12-22 MED ORDER — DEXAMETHASONE SODIUM PHOSPHATE 10 MG/ML IJ SOLN
INTRAMUSCULAR | Status: AC
  Filled 2012-12-22: qty 1

## 2012-12-22 NOTE — Telephone Encounter (Signed)
Pt went to chemo room he is coming back after to get calendar

## 2012-12-22 NOTE — Addendum Note (Signed)
Addended by: Arvilla Meres on: 12/22/2012 10:44 AM   Modules accepted: Orders

## 2012-12-22 NOTE — Telephone Encounter (Signed)
Per staff message and POF I have scheduled appts.  JMW  

## 2012-12-22 NOTE — Patient Instructions (Addendum)
[facility] Discharge Instructions for Patients Receiving Chemotherapy  Today you received the following chemotherapy agents: Oxaliplatin   To help prevent nausea and vomiting after your treatment, we encourage you to take your nausea medication as directed by your physician.   If you develop nausea and vomiting that is not controlled by your nausea medication, call the clinic.   BELOW ARE SYMPTOMS THAT SHOULD BE REPORTED IMMEDIATELY:  *FEVER GREATER THAN 100.5 F  *CHILLS WITH OR WITHOUT FEVER  NAUSEA AND VOMITING THAT IS NOT CONTROLLED WITH YOUR NAUSEA MEDICATION  *UNUSUAL SHORTNESS OF BREATH  *UNUSUAL BRUISING OR BLEEDING  TENDERNESS IN MOUTH AND THROAT WITH OR WITHOUT PRESENCE OF ULCERS  *URINARY PROBLEMS  *BOWEL PROBLEMS  UNUSUAL RASH Items with * indicate a potential emergency and should be followed up as soon as possible.  Feel free to call the clinic you have any questions or concerns. The clinic phone number is [phone].

## 2012-12-22 NOTE — Telephone Encounter (Signed)
Called pt and left message on cell phone regarding appt for October 2014 lab, Md and chemo

## 2012-12-22 NOTE — Progress Notes (Signed)
OFFICE PROGRESS NOTE  Interval history:  Jason Bray is a 57 year old man with stage III colon cancer currently completing adjuvant CAPOX chemotherapy. He completed cycle 4 on 12/01/2012. He is seen today prior to proceeding with cycle #5.  He noted cold sensitivity lasting approximately 10 days. No persistent neuropathy symptoms. No nausea or vomiting. No mouth sores. He had diarrhea for a few days. No skin rash. No hand or foot pain or redness. He denies shortness of breath. He denies chest pain. No leg swelling or calf pain. He has a good appetite. He is gaining weight. He denies bleeding.   Objective: Blood pressure 145/91, pulse 92, temperature 97 F (36.1 C), temperature source Oral, resp. rate 18, height 5\' 7"  (1.702 m), weight 174 lb 14.4 oz (79.334 kg).  Oropharynx is without thrush or ulceration. Lungs are clear. Regular cardiac rhythm. No murmur. Port-A-Cath site is without erythema. Abdomen soft and nontender. No hepatomegaly. Extremities without edema. Calves soft and nontender. Palms without erythema. No skin rash. Vibratory sense mildly decreased at the fingertips.  Lab Results: Lab Results  Component Value Date   WBC 4.7 12/22/2012   HGB 16.0 12/22/2012   HCT 46.7 12/22/2012   MCV 87.3 12/22/2012   PLT 296 12/22/2012    Chemistry:    Chemistry      Component Value Date/Time   NA 142 12/22/2012 0857   NA 140 09/18/2012 1400   K 3.8 12/22/2012 0857   K 4.9 09/18/2012 1400   CL 102 09/18/2012 1400   CO2 30* 12/22/2012 0857   CO2 29 09/18/2012 1400   BUN 13.0 12/22/2012 0857   BUN 15 09/18/2012 1400   CREATININE 0.9 12/22/2012 0857   CREATININE 0.76 09/18/2012 1400   CREATININE 0.88 08/11/2012 1603      Component Value Date/Time   CALCIUM 9.9 12/22/2012 0857   CALCIUM 9.9 09/18/2012 1400   ALKPHOS 117 12/22/2012 0857   ALKPHOS 63 06/17/2009 1358   AST 84* 12/22/2012 0857   AST 24 06/17/2009 1358   ALT 111* 12/22/2012 0857   ALT 23 06/17/2009 1358   BILITOT 0.86 12/22/2012 0857   BILITOT 1.1  06/17/2009 1358       Studies/Results: No results found.  Medications: I have reviewed the patient's current medications.  Assessment/Plan:  1. Stage III (T3 N1) adenocarcinoma of the ascending colon status post partial colectomy 08/25/2012. The tumor is microsatellite stable and there was no loss of mismatch repair protein expression.  He began cycle 1 of adjuvant CAPOX on 09/29/2012.  He completed cycle 2 beginning 10/20/2012.  Cycle 3 beginning on 11/10/2012 Cycle 4 beginning 12/01/2012. 2. Transverse colon polyp removed in the colectomy specimen 08/25/2012. 3. Iron deficiency anemia. He continues oral iron. The hemoglobin and MCV have corrected into normal range. He will discontinue oral iron. 4. Mild elevation of the liver enzymes-likely related to chemotherapy. He will hold the simvastatin until chemotherapy is completed.  Disposition-Jason Bray appears stable. He has completed 4 cycles of CAPOX chemotherapy. Plan to proceed with cycle 5 today as scheduled. At his request we will delay cycle 6 until 01/22/2013. He will be seen prior to treatment on 01/22/2013. He will contact the office in the interim with any problems.  Lonna Cobb ANP/GNP-BC

## 2012-12-28 NOTE — Telephone Encounter (Signed)
RECEIVED A FAX FROM [employer] CONCERNING A CONFIRMATION OF PRESCRIPTION SHIPMENT FOR CAPECITABINE ON 12/27/12. 

## 2013-01-02 ENCOUNTER — Telehealth: Payer: Self-pay | Admitting: *Deleted

## 2013-01-02 NOTE — Telephone Encounter (Signed)
Call from pt's wife asking if he should proceed with the next cycle of Xeloda as scheduled? Oxaliplatin being held due to pt's daughter's upcoming wedding. Reviewed with Dr. Truett Perna: Plan was to hold entire treatment but pt may proceed with Xeloda only for next cycle. Will resume Oxaliplatin with following cycle. Left message on voicemail requesting they call with their decision.  Received call from pt's wife. Pt will proceed with Cycle 6 Xeloda only on 01/12/13. Will resume Cycle 7 Capox on 10/24. Order sent to schedulers.

## 2013-01-04 ENCOUNTER — Telehealth: Payer: Self-pay | Admitting: Oncology

## 2013-01-04 NOTE — Telephone Encounter (Signed)
Called pt and talked to wife gave her appt for lab,md and chemo for October 2014

## 2013-01-05 ENCOUNTER — Telehealth: Payer: Self-pay | Admitting: *Deleted

## 2013-01-05 NOTE — Telephone Encounter (Signed)
Per staff message and POF I have scheduled appts.  JMW  

## 2013-01-12 ENCOUNTER — Ambulatory Visit: Payer: BC Managed Care – PPO

## 2013-01-22 ENCOUNTER — Other Ambulatory Visit: Payer: BC Managed Care – PPO | Admitting: Lab

## 2013-01-22 ENCOUNTER — Ambulatory Visit: Payer: BC Managed Care – PPO

## 2013-01-22 ENCOUNTER — Ambulatory Visit: Payer: BC Managed Care – PPO | Admitting: Oncology

## 2013-02-02 ENCOUNTER — Telehealth: Payer: Self-pay | Admitting: Oncology

## 2013-02-02 ENCOUNTER — Other Ambulatory Visit (HOSPITAL_BASED_OUTPATIENT_CLINIC_OR_DEPARTMENT_OTHER): Payer: BC Managed Care – PPO | Admitting: Lab

## 2013-02-02 ENCOUNTER — Ambulatory Visit (HOSPITAL_BASED_OUTPATIENT_CLINIC_OR_DEPARTMENT_OTHER): Payer: BC Managed Care – PPO

## 2013-02-02 ENCOUNTER — Ambulatory Visit (HOSPITAL_BASED_OUTPATIENT_CLINIC_OR_DEPARTMENT_OTHER): Payer: BC Managed Care – PPO | Admitting: Oncology

## 2013-02-02 ENCOUNTER — Ambulatory Visit: Payer: BC Managed Care – PPO

## 2013-02-02 ENCOUNTER — Other Ambulatory Visit: Payer: Self-pay | Admitting: *Deleted

## 2013-02-02 VITALS — BP 144/93 | HR 82 | Temp 97.8°F | Resp 18 | Ht 67.0 in | Wt 174.1 lb

## 2013-02-02 DIAGNOSIS — D509 Iron deficiency anemia, unspecified: Secondary | ICD-10-CM

## 2013-02-02 DIAGNOSIS — C184 Malignant neoplasm of transverse colon: Secondary | ICD-10-CM

## 2013-02-02 DIAGNOSIS — Z5111 Encounter for antineoplastic chemotherapy: Secondary | ICD-10-CM

## 2013-02-02 DIAGNOSIS — R748 Abnormal levels of other serum enzymes: Secondary | ICD-10-CM

## 2013-02-02 LAB — COMPREHENSIVE METABOLIC PANEL (CC13)
Anion Gap: 11 mEq/L (ref 3–11)
CO2: 23 mEq/L (ref 22–29)
Creatinine: 0.9 mg/dL (ref 0.7–1.3)
Glucose: 111 mg/dl (ref 70–140)
Total Bilirubin: 1.14 mg/dL (ref 0.20–1.20)

## 2013-02-02 LAB — CBC WITH DIFFERENTIAL/PLATELET
Eosinophils Absolute: 0.1 10*3/uL (ref 0.0–0.5)
HCT: 46.3 % (ref 38.4–49.9)
LYMPH%: 23.6 % (ref 14.0–49.0)
MCHC: 33.8 g/dL (ref 32.0–36.0)
MONO#: 0.7 10*3/uL (ref 0.1–0.9)
NEUT#: 3.7 10*3/uL (ref 1.5–6.5)
NEUT%: 62.5 % (ref 39.0–75.0)
Platelets: 280 10*3/uL (ref 140–400)
WBC: 5.9 10*3/uL (ref 4.0–10.3)

## 2013-02-02 MED ORDER — OXALIPLATIN CHEMO INJECTION 100 MG/20ML
130.0000 mg/m2 | Freq: Once | INTRAVENOUS | Status: AC
  Administered 2013-02-02: 245 mg via INTRAVENOUS
  Filled 2013-02-02: qty 49

## 2013-02-02 MED ORDER — DEXAMETHASONE SODIUM PHOSPHATE 10 MG/ML IJ SOLN
10.0000 mg | Freq: Once | INTRAMUSCULAR | Status: AC
  Administered 2013-02-02: 10 mg via INTRAVENOUS

## 2013-02-02 MED ORDER — ONDANSETRON 8 MG/NS 50 ML IVPB
INTRAVENOUS | Status: AC
  Filled 2013-02-02: qty 8

## 2013-02-02 MED ORDER — HEPARIN SOD (PORK) LOCK FLUSH 100 UNIT/ML IV SOLN
500.0000 [IU] | Freq: Once | INTRAVENOUS | Status: AC | PRN
  Administered 2013-02-02: 500 [IU]
  Filled 2013-02-02: qty 5

## 2013-02-02 MED ORDER — CAPECITABINE 500 MG PO TABS: 1500.0000 mg | ORAL_TABLET | Freq: Two times a day (BID) | ORAL | Status: DC

## 2013-02-02 MED ORDER — DEXAMETHASONE SODIUM PHOSPHATE 10 MG/ML IJ SOLN
INTRAMUSCULAR | Status: AC
  Filled 2013-02-02: qty 1

## 2013-02-02 MED ORDER — SODIUM CHLORIDE 0.9 % IJ SOLN
10.0000 mL | INTRAMUSCULAR | Status: DC | PRN
  Administered 2013-02-02: 10 mL
  Filled 2013-02-02: qty 10

## 2013-02-02 MED ORDER — ONDANSETRON 8 MG/50ML IVPB (CHCC)
8.0000 mg | Freq: Once | INTRAVENOUS | Status: AC
  Administered 2013-02-02: 8 mg via INTRAVENOUS

## 2013-02-02 MED ORDER — DEXTROSE 5 % IV SOLN
Freq: Once | INTRAVENOUS | Status: AC
  Administered 2013-02-02: 11:00:00 via INTRAVENOUS

## 2013-02-02 NOTE — Telephone Encounter (Signed)
RECEIVED A FAX FROM [employer] CONCERNING A CONFIRMATION OF FACSIMILE RECEIPT FOR PT. REFERRAL. 

## 2013-02-02 NOTE — Addendum Note (Signed)
Addended by: Arvilla Meres on: 02/02/2013 12:36 PM   Modules accepted: Orders

## 2013-02-02 NOTE — Patient Instructions (Signed)
[facility] Discharge Instructions for Patients Receiving Chemotherapy  Today you received the following chemotherapy agents Oxaliplatin  To help prevent nausea and vomiting after your treatment, we encourage you to take your nausea medication as needed.   If you develop nausea and vomiting that is not controlled by your nausea medication, call the clinic.   BELOW ARE SYMPTOMS THAT SHOULD BE REPORTED IMMEDIATELY:  *FEVER GREATER THAN 100.5 F  *CHILLS WITH OR WITHOUT FEVER  NAUSEA AND VOMITING THAT IS NOT CONTROLLED WITH YOUR NAUSEA MEDICATION  *UNUSUAL SHORTNESS OF BREATH  *UNUSUAL BRUISING OR BLEEDING  TENDERNESS IN MOUTH AND THROAT WITH OR WITHOUT PRESENCE OF ULCERS  *URINARY PROBLEMS  *BOWEL PROBLEMS  UNUSUAL RASH Items with * indicate a potential emergency and should be followed up as soon as possible.  Feel free to call the clinic you have any questions or concerns. The clinic phone number is [phone].

## 2013-02-02 NOTE — Telephone Encounter (Signed)
THIS REFILL REQUEST FOR CAPECITABINE WAS PLACED ON DR.SHERRILL'S DESK. 

## 2013-02-02 NOTE — Telephone Encounter (Signed)
gv and printed appt sched and avs for pt for NOV.Marland KitchenMarland KitchenSED add tx.

## 2013-02-02 NOTE — Progress Notes (Signed)
   [facility]    OFFICE PROGRESS NOTE   INTERVAL HISTORY:   He returns for a scheduled followup visit. He completed another cycle of Xeloda beginning on 01/12/2013. No mouth sores, nausea, diarrhea, or hand/foot pain. He reports chronic dryness over the palms and soles. This predates chemotherapy. Mild tingling in the fingers has not changed and does not interfere with activity.  Objective:  Vital signs in last 24 hours:  Blood pressure 144/93, pulse 82, temperature 97.8 F (36.6 C), temperature source Oral, resp. rate 18, height 5\' 7"  (1.702 m), weight 174 lb 1.6 oz (78.971 kg), SpO2 99.00%.    HEENT: No thrush or ulcers Resp: Lungs clear bilaterally Cardio: Regular rate and rhythm GI: No hepatomegaly, nontender Vascular: No leg edema Neuro:? Very mild decrease in vibratory sense at the fingertips bilaterally  Skin: Skin thickening at the palms, dryness and flaking over the soles. No erythema.   Portacath/PICC-without erythema  Lab Results:  Lab Results  Component Value Date   WBC 5.9 02/02/2013   HGB 15.7 02/02/2013   HCT 46.3 02/02/2013   MCV 95.5 02/02/2013   PLT 280 02/02/2013   ANC 3.7    Medications: I have reviewed the patient's current medications.  Assessment/Plan: 1. Stage III (T3 N1) adenocarcinoma of the ascending colon status post partial colectomy 08/25/2012. The tumor is microsatellite stable and there was no loss of mismatch repair protein expression.  He began cycle 1 of adjuvant CAPOX on 09/29/2012.  He completed cycle 2 beginning 10/20/2012.  Cycle 3 beginning on 11/10/2012  Cycle 4 beginning 12/01/2012. Cycle 5 12/22/2012 Cycle 6-Xeloda only, 01/12/2013 2. Transverse colon polyp removed in the colectomy specimen 08/25/2012. 3. Iron deficiency anemia. He continues oral iron. The hemoglobin and MCV have corrected into normal range. 4. Mild elevation of the liver enzymes-likely related to chemotherapy. He will hold the  simvastatin until chemotherapy is completed. Improved. 5. Early oxaliplatin neuropathy-nonadherent with activity   Disposition:  He has completed 6 cycles of adjuvant therapy. Oxaliplatin was held with cycle 6 so he could attend his daughter's wedding. The plan is to proceed with cycle 7. He will receive oxaliplatin with the chemotherapy today. Mr. Grauberger will return for an office visit and the final planned cycle of chemotherapy in 3 weeks.   [name], MD  02/02/2013  11:12 AM

## 2013-02-07 ENCOUNTER — Ambulatory Visit: Payer: BC Managed Care – PPO | Admitting: Oncology

## 2013-02-07 ENCOUNTER — Other Ambulatory Visit: Payer: BC Managed Care – PPO | Admitting: Lab

## 2013-02-09 ENCOUNTER — Ambulatory Visit: Payer: BC Managed Care – PPO

## 2013-02-12 NOTE — Telephone Encounter (Signed)
RECEIVED A FAX FROM [employer] CONCERNING A CONFIRMATION OF PRESCRIPTION SHIPMENT FOR CAPECITABINE ON 02/09/13. 

## 2013-02-15 ENCOUNTER — Other Ambulatory Visit: Payer: Self-pay | Admitting: *Deleted

## 2013-02-15 ENCOUNTER — Other Ambulatory Visit: Payer: Self-pay

## 2013-02-15 NOTE — Telephone Encounter (Signed)
THIS REFILL REQUEST FOR CAPECITABINE WAS GIVEN TO DR.SHERRILL'S NURSE, TANYA WHITLOCK,RN. 

## 2013-02-18 ENCOUNTER — Other Ambulatory Visit: Payer: Self-pay | Admitting: Oncology

## 2013-02-21 ENCOUNTER — Other Ambulatory Visit: Payer: Self-pay | Admitting: *Deleted

## 2013-02-21 DIAGNOSIS — C184 Malignant neoplasm of transverse colon: Secondary | ICD-10-CM

## 2013-02-21 MED ORDER — CAPECITABINE 500 MG PO TABS: 1500.0000 mg | ORAL_TABLET | Freq: Two times a day (BID) | ORAL | Status: DC

## 2013-02-22 ENCOUNTER — Telehealth: Payer: Self-pay | Admitting: *Deleted

## 2013-02-22 NOTE — Telephone Encounter (Signed)
I have adjusted 11/14 appt

## 2013-02-23 ENCOUNTER — Ambulatory Visit (HOSPITAL_BASED_OUTPATIENT_CLINIC_OR_DEPARTMENT_OTHER): Payer: BC Managed Care – PPO | Admitting: Oncology

## 2013-02-23 ENCOUNTER — Ambulatory Visit (HOSPITAL_BASED_OUTPATIENT_CLINIC_OR_DEPARTMENT_OTHER): Payer: BC Managed Care – PPO

## 2013-02-23 ENCOUNTER — Telehealth: Payer: Self-pay | Admitting: Oncology

## 2013-02-23 ENCOUNTER — Other Ambulatory Visit (HOSPITAL_BASED_OUTPATIENT_CLINIC_OR_DEPARTMENT_OTHER): Payer: BC Managed Care – PPO

## 2013-02-23 VITALS — BP 143/77 | HR 85 | Temp 98.3°F | Resp 20 | Ht 67.0 in | Wt 175.5 lb

## 2013-02-23 DIAGNOSIS — C184 Malignant neoplasm of transverse colon: Secondary | ICD-10-CM

## 2013-02-23 DIAGNOSIS — C182 Malignant neoplasm of ascending colon: Secondary | ICD-10-CM

## 2013-02-23 DIAGNOSIS — Z5111 Encounter for antineoplastic chemotherapy: Secondary | ICD-10-CM

## 2013-02-23 DIAGNOSIS — R748 Abnormal levels of other serum enzymes: Secondary | ICD-10-CM

## 2013-02-23 DIAGNOSIS — G620 Drug-induced polyneuropathy: Secondary | ICD-10-CM

## 2013-02-23 LAB — CBC WITH DIFFERENTIAL/PLATELET
BASO%: 0.8 % (ref 0.0–2.0)
Basophils Absolute: 0 10*3/uL (ref 0.0–0.1)
EOS%: 1.8 % (ref 0.0–7.0)
Eosinophils Absolute: 0.1 10*3/uL (ref 0.0–0.5)
HGB: 16 g/dL (ref 13.0–17.1)
MCH: 33.3 pg (ref 27.2–33.4)
MCHC: 34.1 g/dL (ref 32.0–36.0)
NEUT#: 2 10*3/uL (ref 1.5–6.5)
RDW: 17.4 % — ABNORMAL HIGH (ref 11.0–14.6)
lymph#: 1.3 10*3/uL (ref 0.9–3.3)

## 2013-02-23 LAB — COMPREHENSIVE METABOLIC PANEL (CC13)
ALT: 52 U/L (ref 0–55)
AST: 56 U/L — ABNORMAL HIGH (ref 5–34)
Albumin: 4 g/dL (ref 3.5–5.0)
BUN: 11.9 mg/dL (ref 7.0–26.0)
Calcium: 10.1 mg/dL (ref 8.4–10.4)
Chloride: 104 mEq/L (ref 98–109)
Potassium: 4.2 mEq/L (ref 3.5–5.1)
Total Protein: 8.6 g/dL — ABNORMAL HIGH (ref 6.4–8.3)

## 2013-02-23 MED ORDER — OXALIPLATIN CHEMO INJECTION 100 MG/20ML
130.0000 mg/m2 | Freq: Once | INTRAVENOUS | Status: AC
  Administered 2013-02-23: 245 mg via INTRAVENOUS
  Filled 2013-02-23: qty 49

## 2013-02-23 MED ORDER — DEXTROSE 5 % IV SOLN
Freq: Once | INTRAVENOUS | Status: AC
  Administered 2013-02-23: 10:00:00 via INTRAVENOUS

## 2013-02-23 MED ORDER — DEXAMETHASONE SODIUM PHOSPHATE 10 MG/ML IJ SOLN
INTRAMUSCULAR | Status: AC
  Filled 2013-02-23: qty 1

## 2013-02-23 MED ORDER — DEXAMETHASONE SODIUM PHOSPHATE 10 MG/ML IJ SOLN
10.0000 mg | Freq: Once | INTRAMUSCULAR | Status: AC
  Administered 2013-02-23: 10 mg via INTRAVENOUS

## 2013-02-23 MED ORDER — ONDANSETRON 8 MG/NS 50 ML IVPB
INTRAVENOUS | Status: AC
  Filled 2013-02-23: qty 8

## 2013-02-23 MED ORDER — SODIUM CHLORIDE 0.9 % IJ SOLN
10.0000 mL | INTRAMUSCULAR | Status: DC | PRN
Start: 2013-02-23 — End: 2013-02-23
  Administered 2013-02-23: 10 mL
  Filled 2013-02-23: qty 10

## 2013-02-23 MED ORDER — HEPARIN SOD (PORK) LOCK FLUSH 100 UNIT/ML IV SOLN
500.0000 [IU] | Freq: Once | INTRAVENOUS | Status: AC | PRN
  Administered 2013-02-23: 500 [IU]
  Filled 2013-02-23: qty 5

## 2013-02-23 MED ORDER — ONDANSETRON 8 MG/50ML IVPB (CHCC)
8.0000 mg | Freq: Once | INTRAVENOUS | Status: AC
  Administered 2013-02-23: 8 mg via INTRAVENOUS

## 2013-02-23 NOTE — Patient Instructions (Signed)
Remember to stay warm/avoid cold outdoor temperatures while being treated with the Oxaliplatin this Fall & Winter. Coat, gloves, and cover mouth & nose in extreme cold.

## 2013-02-23 NOTE — Patient Instructions (Signed)
[facility] Discharge Instructions for Patients Receiving Chemotherapy  Today you received the following chemotherapy agents: Oxaliplatin  To help prevent nausea and vomiting after your treatment, we encourage you to take your nausea medication as prescribed.    If you develop nausea and vomiting that is not controlled by your nausea medication, call the clinic.   BELOW ARE SYMPTOMS THAT SHOULD BE REPORTED IMMEDIATELY:  *FEVER GREATER THAN 100.5 F  *CHILLS WITH OR WITHOUT FEVER  NAUSEA AND VOMITING THAT IS NOT CONTROLLED WITH YOUR NAUSEA MEDICATION  *UNUSUAL SHORTNESS OF BREATH  *UNUSUAL BRUISING OR BLEEDING  TENDERNESS IN MOUTH AND THROAT WITH OR WITHOUT PRESENCE OF ULCERS  *URINARY PROBLEMS  *BOWEL PROBLEMS  UNUSUAL RASH Items with * indicate a potential emergency and should be followed up as soon as possible.  Feel free to call the clinic you have any questions or concerns. The clinic phone number is [phone].    

## 2013-02-23 NOTE — Progress Notes (Signed)
   [facility]    OFFICE PROGRESS NOTE   INTERVAL HISTORY:   He returns for scheduled followup of colon cancer. He completed another cycle of CAPOX beginning on 02/02/2013. No mouth sores, nausea, diarrhea, or hand/foot pain. Stable skin "dryness "over the feet. He reports cold sensitivity lasting 8-9 days following chemotherapy. He has noted feeling like a "feather "is touching the tip of his nose on cold days. Mild tingling in the fingertips. No numbness. No difficulty buttoning his shirt, tying shoes, turning pages, or holding a cup.  Objective:  Vital signs in last 24 hours:  Blood pressure 143/77, pulse 85, temperature 98.3 F (36.8 C), temperature source Oral, resp. rate 20, height 5\' 7"  (1.702 m), weight 175 lb 8 oz (79.606 kg).    HEENT: No thrush or ulcer Lymphatics: No cervical, supraclavicular, axillary, or inguinal nodes Resp: Lungs clear bilaterally Cardio:  Regular rate and rhythm GI: No hepatomegaly, nontender, no mass Vascular: No leg edema Neuro: The vibratory sense is intact at the fingertips bilaterally.  Skin: Mild hyperpigmentation with skin thickening at the soles. No ulcers   Portacath/PICC-without erythema  Lab Results:  Lab Results  Component Value Date   WBC 3.9* 02/23/2013   HGB 16.0 02/23/2013   HCT 46.9 02/23/2013   MCV 97.7 02/23/2013   PLT 244 02/23/2013   ANC 2.0    Medications: I have reviewed the patient's current medications.  Assessment/Plan: 1. Stage III (T3 N1) adenocarcinoma of the ascending colon status post partial colectomy 08/25/2012. The tumor is microsatellite stable and there was no loss of mismatch repair protein expression.  He began cycle 1 of adjuvant CAPOX on 09/29/2012.  He completed cycle 2 beginning 10/20/2012.  Cycle 3 beginning on 11/10/2012  Cycle 4 beginning 12/01/2012.  Cycle 5 12/22/2012  Cycle 6-Xeloda only, 01/12/2013 Cycle 7 02/02/2013 2. Transverse colon polyp removed in the colectomy  specimen 08/25/2012. 3. Iron deficiency anemia. The hemoglobin and MCV have corrected into normal range. 4. Mild elevation of the liver enzymes-likely related to chemotherapy. He will hold the simvastatin until chemotherapy is completed.  5. 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 6 cycles have included oxaliplatin. He appears to have mild neuropathy symptoms. We discussed the risk/benefit of continuing oxaliplatin. He understands the oxaliplatin neuropathy can progress over several months following the completion of therapy.  He decided to proceed with cycle 8 CAPOX today. He will return for an office visit in one month. We will followup on the liver enzymes when he returns next month. He will then be referred to Dr. Michaell Cowing for Port-A-Cath removal. We checked a CEA today.   [name], MD  02/23/2013  9:37 AM

## 2013-02-23 NOTE — Telephone Encounter (Signed)
Gave pt appt for lab and MD only in december 2014

## 2013-02-26 ENCOUNTER — Other Ambulatory Visit: Payer: Self-pay | Admitting: *Deleted

## 2013-02-26 DIAGNOSIS — C184 Malignant neoplasm of transverse colon: Secondary | ICD-10-CM

## 2013-02-26 NOTE — Telephone Encounter (Signed)
RECEIVED A FAX FROM [employer] CONCERNING A CONFIRMATION OF PRESCRIPTION SHIPMENT FOR CAPECITABINE ON 02/23/13.

## 2013-03-22 ENCOUNTER — Telehealth: Payer: Self-pay | Admitting: Oncology

## 2013-03-22 ENCOUNTER — Ambulatory Visit (HOSPITAL_BASED_OUTPATIENT_CLINIC_OR_DEPARTMENT_OTHER): Payer: BC Managed Care – PPO | Admitting: Oncology

## 2013-03-22 ENCOUNTER — Other Ambulatory Visit (HOSPITAL_BASED_OUTPATIENT_CLINIC_OR_DEPARTMENT_OTHER): Payer: BC Managed Care – PPO

## 2013-03-22 VITALS — BP 153/91 | HR 85 | Temp 97.6°F | Resp 20 | Ht 67.0 in | Wt 174.8 lb

## 2013-03-22 DIAGNOSIS — R945 Abnormal results of liver function studies: Secondary | ICD-10-CM

## 2013-03-22 DIAGNOSIS — C182 Malignant neoplasm of ascending colon: Secondary | ICD-10-CM

## 2013-03-22 DIAGNOSIS — C184 Malignant neoplasm of transverse colon: Secondary | ICD-10-CM

## 2013-03-22 DIAGNOSIS — G620 Drug-induced polyneuropathy: Secondary | ICD-10-CM

## 2013-03-22 DIAGNOSIS — D509 Iron deficiency anemia, unspecified: Secondary | ICD-10-CM

## 2013-03-22 LAB — CBC WITH DIFFERENTIAL/PLATELET
Eosinophils Absolute: 0.1 10*3/uL (ref 0.0–0.5)
LYMPH%: 32.3 % (ref 14.0–49.0)
MCH: 35 pg — ABNORMAL HIGH (ref 27.2–33.4)
MCHC: 33.9 g/dL (ref 32.0–36.0)
MCV: 103.1 fL — ABNORMAL HIGH (ref 79.3–98.0)
MONO%: 18.5 % — ABNORMAL HIGH (ref 0.0–14.0)
Platelets: 198 10*3/uL (ref 140–400)
RBC: 4.57 10*6/uL (ref 4.20–5.82)
RDW: 19 % — ABNORMAL HIGH (ref 11.0–14.6)

## 2013-03-22 LAB — COMPREHENSIVE METABOLIC PANEL (CC13)
AST: 68 U/L — ABNORMAL HIGH (ref 5–34)
Alkaline Phosphatase: 134 U/L (ref 40–150)
Anion Gap: 10 mEq/L (ref 3–11)
CO2: 25 mEq/L (ref 22–29)
Calcium: 9.7 mg/dL (ref 8.4–10.4)
Creatinine: 0.8 mg/dL (ref 0.7–1.3)
Glucose: 98 mg/dl (ref 70–140)
Sodium: 140 mEq/L (ref 136–145)
Total Bilirubin: 1.64 mg/dL — ABNORMAL HIGH (ref 0.20–1.20)
Total Protein: 8.3 g/dL (ref 6.4–8.3)

## 2013-03-22 LAB — CEA: CEA: 2.3 ng/mL (ref 0.0–5.0)

## 2013-03-22 NOTE — Progress Notes (Signed)
   [facility]    OFFICE PROGRESS NOTE   INTERVAL HISTORY:   He completed a final cycle of CAPOX beginning on 02/23/2013. He reports cold sensitivity following chemotherapy. This has resolved. He has mild tingling in the fingers and he feels like he is wearing "socks "when he is not. The symptoms do not interfere with activity. No other complaint.  Objective:  Vital signs in last 24 hours:  Blood pressure 153/91, pulse 85, temperature 97.6 F (36.4 C), temperature source Oral, resp. rate 20, height 5\' 7"  (1.702 m), weight 174 lb 12.8 oz (79.289 kg).    HEENT: No thrush or ulcers Lymphatics: No cervical, supra-clavicular, axillary, or inguinal nodes Resp: Lungs clear bilaterally Cardio: Regular rate and rhythm GI: No hepatosplenomegaly, nontender, no mass Vascular: No leg edema Neuro: The vibratory sense is intact at the fingertips bilaterally  Skin: Mild skin thickening over the hands. Skin thickening and superficial desquamation over the soles. No erythema.   Portacath/PICC-without erythema  Lab Results:  Lab Results  Component Value Date   WBC 3.9* 03/22/2013   HGB 16.0 03/22/2013   HCT 47.1 03/22/2013   MCV 103.1* 03/22/2013   PLT 198 03/22/2013   1.8  CEA on 02/23/2013-2.6    Medications: I have reviewed the patient's current medications.  Assessment/Plan: 1. Stage III (T3 N1) adenocarcinoma of the ascending colon status post partial colectomy 08/25/2012. The tumor is microsatellite stable and there was no loss of mismatch repair protein expression.  He began cycle 1 of adjuvant CAPOX on 09/29/2012.  He completed cycle 2 beginning 10/20/2012.  Cycle 3 beginning on 11/10/2012  Cycle 4 beginning 12/01/2012.  Cycle 5 12/22/2012  Cycle 6-Xeloda only, 01/12/2013  Cycle 7 02/02/2013 Cycle 8 02/23/2013 2. Transverse colon polyp removed in the colectomy specimen 08/25/2012. 3. Iron deficiency anemia. The hemoglobin and MCV have corrected into  normal range. 4. Mild elevation of the liver enzymes-likely related to chemotherapy. The simvastatin has been on hold. We will followup on the liver panel from today.  5. 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 He appears to be in clinical remission from colon cancer. Mr. Zaccone will be referred to Dr. Michaell Cowing for removal of the Port-A-Cath. He will return for an office visit, CEA, and restaging CT scans in May of 2015.  Hopefully the neuropathy symptoms will improve over the next few months. We will followup on the chemistry panel from today and decide on resuming the simvastatin.   [name], MD  03/22/2013  9:14 AM

## 2013-03-22 NOTE — Telephone Encounter (Signed)
appts made per 12/11 POF sw Jason Bray at [facility] scheduled port removal consult AVS and CAL given Pt will pick barium up prior to CT in May 2015 shh

## 2013-03-23 ENCOUNTER — Telehealth: Payer: Self-pay | Admitting: *Deleted

## 2013-03-23 NOTE — Telephone Encounter (Signed)
Left VM for patient to call office for lab results-not urgent 

## 2013-03-23 NOTE — Telephone Encounter (Signed)
Message copied by Caleb Popp on Fri Mar 23, 2013 10:32 AM ------      Message from: [name] B      Created: Thu Mar 22, 2013  8:16 PM       Please call patient,  cea is normal, liver enzymes are mildly elevated, repeat cmet 3 months here or with primary MD.  Discuss whether to resume cholesterol medication with primary MD ------

## 2013-03-23 NOTE — Telephone Encounter (Signed)
Message copied by Wandalee Ferdinand on Fri Mar 23, 2013  3:53 PM ------      Message from: [name] B      Created: Thu Mar 22, 2013  8:16 PM       Please call patient,  cea is normal, liver enzymes are mildly elevated, repeat cmet 3 months here or with primary MD.  Discuss whether to resume cholesterol medication with primary MD ------

## 2013-03-26 ENCOUNTER — Telehealth: Payer: Self-pay | Admitting: *Deleted

## 2013-03-26 NOTE — Telephone Encounter (Signed)
Called pt with lab results. CEA normal, liver enzymes mildly elevated. Dr. Truett Perna recommends checking CMET again in 3 mos. Can be done here or with PCP. Follow up with PCP re: cholesterol medicine. Pt voiced understanding. Stated he will call PCP.

## 2013-03-27 ENCOUNTER — Telehealth: Payer: Self-pay | Admitting: *Deleted

## 2013-03-27 NOTE — Telephone Encounter (Signed)
Message from pt requesting return call. Stated he was confused about lab results. Called back, left detailed voicemail with instructions to repeat lab in 3 months, discuss cholesterol med with PCP, per Dr. Truett Perna. Requested he call to schedule lab if he wants this done in our office.

## 2013-04-09 ENCOUNTER — Ambulatory Visit (INDEPENDENT_AMBULATORY_CARE_PROVIDER_SITE_OTHER): Payer: BC Managed Care – PPO | Admitting: Surgery

## 2013-04-09 ENCOUNTER — Encounter (INDEPENDENT_AMBULATORY_CARE_PROVIDER_SITE_OTHER): Payer: Self-pay | Admitting: Surgery

## 2013-04-09 VITALS — BP 140/92 | HR 88 | Temp 97.9°F | Resp 15 | Ht 67.5 in | Wt 178.6 lb

## 2013-04-09 DIAGNOSIS — IMO0002 Reserved for concepts with insufficient information to code with codable children: Secondary | ICD-10-CM | POA: Insufficient documentation

## 2013-04-09 DIAGNOSIS — C184 Malignant neoplasm of transverse colon: Secondary | ICD-10-CM

## 2013-04-09 DIAGNOSIS — G622 Polyneuropathy due to other toxic agents: Secondary | ICD-10-CM

## 2013-04-09 NOTE — Progress Notes (Signed)
Subjective:     Patient ID: Jason Bray, male   DOB: [birth year], 57 y.o.   MRN: [id number]  HPI   Jason Bray  [birth year] [id number]  Patient Care Team: Jason Boys, MD as PCP - General (Family Medicine) Jason Friar, MD as Consulting Physician (Gastroenterology) Jason Sportsman, MD as Consulting Physician (General Surgery) Jason Stade, MD as Consulting Physician (Cardiology) Jason Artist, MD as Consulting Physician (Hematology and Oncology)  This patient is a 57 y.o.male who presents today for surgical evaluation s/p surgery 08/25/2012  POST-OPERATIVE DIAGNOSIS: cancer hepatic flexure and polyp mid transverse colon   PROCEDURE: Procedure(s):  LAPAROSCOPIC PARTIAL COLECTOMY   SURGEON: Surgeon(s):  Jason Sportsman, MD  Jason Levee, MD - Asst   Patient comes today feeling well.  He completed post adjuvant chemotherapy through this port a catheter.  He did have some tingling and cold intolerance in his fingers.  Mild numbness at the left ball of his feet.  The neuropathy has remained stable since completing chemotherapy last month.  Otherwise in good spirits.  He is eating well.  Energy level is good.  No fevers or chills.  CEA was normal.  LFTs mildly elevated but not severe.  3 month followup tube.  He is sent to me by Jason Bray to have the porta catheter removed since chemotherapy has been completed.  The patient is interested in proceeding with this as well Patient Active Problem List   Diagnosis Date Noted  . Cancer of transverse colon s/p partial colectomy pT3,pN1a (1/28),pMX 08/25/2012 08/09/2012  . Adenomatous colon polyp - mid transverse colon s/p removal 08/09/2012  . HYPERLIPIDEMIA 07/09/2009  . ANXIETY 07/09/2009  . CHEST PAIN 07/09/2009    Past Medical History  Diagnosis Date  . Hyperlipidemia   . Anxiety   . GERD (gastroesophageal reflux disease)   . Cancer     cancerous colon polyp  . Anemia     Past Surgical History    Procedure Laterality Date  . Cardiac catheterization  06/2009    non cardiac  . Colonoscopy    . Laparoscopic partial colectomy N/A 08/25/2012    Procedure: LAPAROSCOPIC PARTIAL COLECTOMY  ;  Surgeon: Jason Sportsman, MD;  Location: [facility];  Service: General;  Laterality: N/A;  . Portacath placement N/A 09/22/2012    Procedure: INSERTION PORT-A-CATH UNDER ULTRASOUND & FLUROSCOPIC GUIDANCE;  Surgeon: Jason Sportsman, MD;  Location: [facility];  Service: General;  Laterality: N/A;    History   Social History  . Marital Status: Married    Spouse Name: Jason Bray    Number of Children: 3  . Years of Education: N/A   Occupational History  . Emergency planning/management officer    Social History Main Topics  . Smoking status: Former Smoker -- 1.00 packs/day for 20 years    Types: Cigarettes    Quit date: 01/19/2007  . Smokeless tobacco: Former Neurosurgeon    Types: Chew    Quit date: 01/18/1998  . Alcohol Use: 0.0 oz/week     Comment: weekly   . Drug Use: No  . Sexual Activity: Not on file   Other Topics Concern  . 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 Lymphoma Mother   . Hypertension  Father   . Hyperlipidemia Father   . Hypertension Brother     Current Outpatient Prescriptions  Medication Sig Dispense Refill  . metoprolol succinate (TOPROL-XL) 50 MG 24 hr tablet Take 50 mg by mouth at bedtime. Take with or immediately following a meal.       No current facility-administered medications for this visit.     No Known Allergies  BP 140/92  Pulse 88  Temp(Src) 97.9 F (36.6 C) (Temporal)  Resp 15  Ht 5' 7.5" (1.715 m)  Wt 178 lb 9.6 oz (81.012 kg)  BMI 27.54 kg/m2  Dg Chest Port 1 View  09/22/2012   *RADIOLOGY REPORT*  Clinical Data: The Port-A-Cath placement.  PORTABLE CHEST - 1 VIEW  Comparison: The chest  radiograph 06/17/2009  Findings: Interval placement of a right power port with the tip 6.5 cm below the carina at the level of the cavoatrial junction.  No evidence of pneumothorax.  No pleural fluid.  No pulmonary edema. Cardiac silhouettes is normal  IMPRESSION: Interval placement of a right power port with the tip at or just below the cavoatrial junction.  Findings discussed with Dr. Michaell Bray on 09/22/2012 at 1400 hours .   Original Report Authenticated By: Jason Bray, M.D.   Dg C-arm 1-60 Min-no Report  09/22/2012   CLINICAL DATA: portacath   C-ARM 1-60 MINUTES  Fluoroscopy was utilized by the requesting physician.  No radiographic  interpretation.     Diagnosis Colon, segmental resection for tumor, proximal - INVASIVE MODERATELY DIFFERENTIATED ADENOCARCINOMA, INVADING THROUGH THE MUSCULARIS PROPRIA INTO PERICOLONIC FATTY TISSUE; NO EVIDENCE OF ANGIOLYMPHATIC INVASION IDENTIFIED. - A LARGE TUBULAR ADENOMA WITH NO EVIDENCE OF HIGH GRADE DYSPLASIA OR MALIGNANCY (1.7 CM). - ONE OF TWENTY-EIGHT LYMPH NODES, POSITIVE FOR METASTATIC CARCINOMA WITH EXTRANODAL EXTENSION (1/28) - RESECTION MARGINS, NEGATIVE FOR MALIGNANCY. 1 of 3 FINAL for Jason Bray ([id number]) Microscopic Comment COLON AND RECTUM Specimen: Proximal colon Procedure: Segmental resection Tumor site: Transverse colon Specimen integrity: Intact Macroscopic intactness of mesorectum: N/A Macroscopic tumor perforation: No Invasive tumor: Maximum size: 6.0 cm, Manreet Kiernan measurement Histologic type(s): Invasive adenocarcinoma Histologic grade and differentiation: G2: moderately differentiated/low grade Type of polyp in which invasive carcinoma arose: N/A Microscopic extension of invasive tumor: Invading through the muscularis propria into pericolonic fatty tissue. Lymph-Vascular invasion: Not identified Peri-neural invasion: Not identified Tumor deposit(s) (discontinuous extramural extension): N/A Resection margins:  Negative Proximal margin: 20 cm Distal margin: 21 cm Circumferential (radial) (posterior ascending, posterior descending; lateral and posterior mid-rectum; and entire lower 1/3 rectum): 4.5 cm Treatment effect (neoadjuvant therapy): No Number of lymph nodes examined 28; Number positive 1 Additional polyp(s): A large tubular adenoma (1.7 cm) with no evidence of high grade dysplasia or malignancy Non-neoplastic findings: N/A Pathologic Staging: pT3,pN1a,pMX Ancillary studies: A tumor block will be sent out for MSI testing and an addendum report will follow. (HCL:kh 08/29/18) Abigail Miyamoto MD Pathologist, Electronic Signature (Case signed 08/28/2012) Specimen Vibhav Waddill and Clinical Information Specimen(s) Obtained: Colon, segmental resection for tumor, proximal Specimen Clinical Information cancer hepatic flexor polyp mid transverse colon (kp) Dorrian Doggett Specimen: Proximal colon, received in formalin Specimen integrity: Intact Specimen length: The specimen consists of 10 cm of terminal ileum and 35 cm of colon Tumor location: The tumor is located in the ascending colon and is circumferential. Tumor size: The tumor consists of a 6 x 3.8 cm sessile, ulcerated mass which has a maximum thickness of 1 cm. Percent of bowel circumference involved: 100% Tumor distance to margins: Proximal: 20 cm Distal:  21 cm Radial (posterior ascending, posterior descending; lateral and posterior mid-rectum; and entire lower 1/3 rectum): 4.5 cm 2 of 3 FINAL for JAYDIEN, PANEPINTO ([id number]) Syaire Saber(continued) Macroscopic extent of tumor invasion: Tumor invades through the muscularis propria into the subserosal adipose tissue but does not involve the serosa. Total presumed lymph nodes: There are 26 rubbery tan red ovoid nodules tentatively identified as lymph nodes varying in size from 0.3 to 0.8 cm in greatest dimensions. Extramural satellite tumor nodules: Not grossly identified. Mucosal polyp(s): 5 cm from  the distal margin there is a 1.7 x 1.5 x 1 cm sessile, rubbery mucosal polyp. Additional findings: The uninvolved mucosa is glistening and tan. The appendix is present and measures 4.2 cm in length x 0.6 cm in diameter. Block summary: Fifteen blocks submitted A = proximal margin B = distal margin C = proximal tumor to uninvolved mucosa transition D = distal tumor to uninvolved mucosa transition E = deep tumor extension F = tumor G = tissue for molecular testing H, I = mucosal polyp J = appendix K-O = lymph nodes (GRP:kh 08-28-12) Report signed out from the following location(s) [facility] [address],[place] [address].CLIA:[id number],CAP:[id number]., [facility] [address], , [place] [address]. CLIA #: [id number], 3 of    Assessment:                 Review of Systems  Constitutional: Negative for fever, chills and diaphoresis.  HENT: Negative for ear discharge, facial swelling, mouth sores, nosebleeds, sore throat and trouble swallowing.   Eyes: Negative for photophobia, discharge and visual disturbance.  Respiratory: Negative for choking, chest tightness, shortness of breath and stridor.   Cardiovascular: Negative for chest pain and palpitations.  Gastrointestinal: Negative for nausea, vomiting, abdominal pain, diarrhea, constipation, blood in stool, abdominal distention, anal bleeding and rectal pain.  Endocrine: Negative for cold intolerance and heat intolerance.  Genitourinary: Negative for dysuria, urgency, difficulty urinating and testicular pain.  Musculoskeletal: Negative for arthralgias, back pain, gait problem and myalgias.  Skin: Negative for color change, pallor, rash and wound.  Allergic/Immunologic: Negative for environmental allergies and food allergies.  Neurological: Negative for dizziness, speech difficulty, weakness, numbness and headaches.  Hematological: Negative for adenopathy. Does not  bruise/bleed easily.  Psychiatric/Behavioral: Negative for hallucinations, confusion and agitation.       Objective:   Physical Exam  Constitutional: He is oriented to person, place, and time. He appears well-developed and well-nourished. No distress.  HENT:  Head: Normocephalic.  Mouth/Throat: Oropharynx is clear and moist. No oropharyngeal exudate.  Eyes: Conjunctivae and EOM are normal. Pupils are equal, round, and reactive to light. No scleral icterus.  Neck: Normal range of motion. Neck supple. No tracheal deviation present.  Cardiovascular: Normal rate, regular rhythm and intact distal pulses.   Pulmonary/Chest: Effort normal and breath sounds normal. No respiratory distress.    Abdominal: Soft. He exhibits no distension. There is no tenderness. Hernia confirmed negative in the right inguinal area and confirmed negative in the left inguinal area.  Musculoskeletal: Normal range of motion. He exhibits no tenderness.  Lymphadenopathy:    He has no cervical adenopathy.       Right: No inguinal adenopathy present.       Left: No inguinal adenopathy present.  Neurological: He is alert and oriented to person, place, and time. No cranial nerve deficit. He exhibits normal muscle tone. Coordination normal.  Skin: Skin is warm and dry. No rash noted. He is not diaphoretic. No  erythema. No pallor.  Psychiatric: He has a normal mood and affect. His behavior is normal. Judgment and thought content normal.         Assessment:     Recovering well status post resection for node-positive cancer of hepatic flexure, NED 7 months ago.  6 weeks s/p completion of chemoTx    Plan:     I will remove his Port-A-Cath.  The oncologist and patient wishes.  There is a risk he would need chemotherapy again, but hopefully it is mild at this point.  I discussed the procedure with him.  Should be able to do under local anesthetic in our office:  I recommended surgery to remove the catheter.  I explained  the technique of removal with use of local anesthesia & possible need for more aggressive sedation/anesthesia for patient comfort.    Risks such as bleeding, infection, and other risks were discussed.  Post-operative dressing/incision care was discussed.  I noted a good likelihood this will help address the problem.   We will work to minimize complications. Questions were answered.  Hold aspirin for 7 days.  He is only rarely using it anyway.The patient expresses understanding & wishes to proceed with surgery.   Regular activity.  Low impact exercise such as walking an hour a day at least ideal.  Okay for swimming.  Recommend showering after use of private/public pool or swimming in ocean/lake.  Do not push through pain.  Diet as tolerated.  Low fat high fiber diet ideal.  Bowel regimen with 30 g fiber a day and fiber supplement as needed to avoid problems.  Mild peripheral neuropathy most likely related to the oxaliplatin.  Jason Bray feels it will gradually resolve over the year.  The patient did not seem too concerned at this point.  He will followup with Jason Bray in May 2015  One-year followup colonoscopy to rule out new polyps/cancers.  May 2015.  I encouraged him to call his gastroenterology and make sure that that is happening  Return to clinic as needed.   Instructions discussed.  Followup with primary care physician for other health issues as would normally be done.  Questions answered.  The patient expressed understanding and appreciation

## 2013-04-09 NOTE — Patient Instructions (Signed)
See the Handout(s) we gave you.  Consider surgery to remove the portacatheter in our office under local anesthesia.  Please hold aspirin for 7 days prior to the procedure.  Please call our office at [phone] if you wish to schedule surgery or if you have further questions / concerns.   Please make sure you have a follow up colonoscopy with [facility] gastroenterology in May 2015  Colorectal Cancer Colorectal cancer is an abnormal growth of tissue (tumor) in the colon or rectum that is cancerous (malignant). Unlike noncancerous (benign) tumors, malignant tumors can spread to other parts of your body. The colon is the large bowel or large intestine. The rectum is the last several inches of the colon.  RISK FACTORS The exact cause of colorectal cancer is unknown. However, the following factors may increase your chances of getting colorectal cancer:   Age older than 50 years.   Abnormal growths (polyps) on the inner wall of the colon or rectum.   Diabetes.   African American race.   Family history of hereditary nonpolyposis colorectal cancer. This condition is caused by changes in the genes that are responsible for repairing mismatched DNA.   Personal history of cancer. A person who has already had colorectal cancer may develop it a second time. Also, women with a history of ovarian, uterine, or breast cancer are at a somewhat higher risk of developing colorectal cancer.  Certain hereditary conditions.  Eating a diet that is high in fat (especially animal fat) and low in fiber, fruits, and vegetables.  Sedentary lifestyle.  Inflammatory bowel disease, including ulcerative colitis and Crohn disease.   Smoking.   Excessive alcohol use.  SYMPTOMS Early colorectal cancer often does not cause symptoms. As the cancer grows, symptoms may include:   Changes in bowel habits.  Diarrhea.   Constipation.   Feeling like the bowel does not empty completely after a bowel movement.    Blood in the stool.   Stools that are narrower than usual.   Abdominal discomfort, pain, bloating, fullness, or cramps.  Frequent gas pain.   Unexplained weight loss.   Constant tiredness.   Nausea and vomiting.  DIAGNOSIS  Your health care provider will ask about your medical history. He or she may also perform a number of procedures, such as:   A physical exam.  A digital rectal exam.  A fecal occult blood test.  A barium enema.  Blood tests.   X-rays.   Imaging tests, such as CT scans or MRIs.   Taking a tissue sample (biopsy) from your colon or rectum to look for cancer cells.   A sigmoidoscopy to view the inside of the last part of your colon.   A colonoscopy to view the inside of your entire colon.   An endorectal ultrasound to see how deep a rectal tumor has grown and whether the cancer has spread to lymph nodes or other nearby tissues.  Your cancer will be staged to determine its severity and extent. Staging is a careful attempt to find out the size of the tumor, whether the cancer has spread, and if so, to what parts of the body. You may need to have more tests to determine the stage of your cancer. The test results will help determine what treatment plan is best for you.   Stage 0 The cancer is found only in the innermost lining of the colon or rectum.   Stage I The cancer has grown into the inner wall of the colon  or rectum. The cancer has not yet reached the outer wall of the colon.   Stage II The cancer extends more deeply into or through the wall of the colon or rectum. It may have invaded nearby tissue, but cancer cells have not spread to the lymph nodes.   Stage III The cancer has spread to nearby lymph nodes but not to other parts of the body.   Stage IV The cancer has spread to other parts of the body, such as the liver or lungs.  Your health care provider may tell you the detailed stage of your cancer, which includes both a  number and a letter.  TREATMENT  Depending on the type and stage, colorectal cancer may be treated with surgery, radiation therapy, chemotherapy, targeted therapy, or radiofrequency ablation. Some people have a combination of these therapies. Surgery may be done to remove the polyps from your colon. In early stages, your health care provider may be able to do this during a colonoscopy. In later stages, surgery may be done to remove part of your colon.  HOME CARE INSTRUCTIONS   Only take over-the-counter or prescription medicines for pain, discomfort, or fever as directed by your health care provider.   Maintain a healthy diet.   Consider joining a support group. This may help you learn to cope with the stress of having colorectal cancer.   Seek advice to help you manage treatment of side effects.   Keep all follow-up appointments as directed by your health care provider.   Inform your cancer specialist if you are admitted to the hospital.  SEEK MEDICAL CARE IF:  Your diarrhea or constipation does not go away.   Your bowel habits change.  You have increased abdominal pain.   You notice new fatigue or weakness.  You lose weight. Document Released: 03/29/2005 Document Revised: 11/29/2012 Document Reviewed: 09/21/2012 [facility] Patient Information 2014 [place], [place].   GENERAL SURGERY: POST OP INSTRUCTIONS  1. DIET: Follow a light bland diet the first 24 hours after arrival home, such as soup, liquids, crackers, etc.  Be sure to include lots of fluids daily.  Avoid fast food or heavy meals as your are more likely to get nauseated.   2. Take your usually prescribed home medications unless otherwise directed. 3. PAIN CONTROL: a. Pain is best controlled by a usual combination of three different methods TOGETHER: i. Ice/Heat ii. Over the counter pain medication iii. Prescription pain medication b. Most patients will experience some swelling and bruising around the incisions.   Ice packs or heating pads (30-60 minutes up to 6 times a day) will help. Use ice for the first few days to help decrease swelling and bruising, then switch to heat to help relax tight/sore spots and speed recovery.  Some people prefer to use ice alone, heat alone, alternating between ice & heat.  Experiment to what works for you.  Swelling and bruising can take several weeks to resolve.   c. It is helpful to take an over-the-counter pain medication regularly for the first few weeks.  Choose one of the following that works best for you: i. Naproxen (Aleve, etc)  Two 220mg  tabs twice a day ii. Ibuprofen (Advil, etc) Three 200mg  tabs four times a day (every meal & bedtime) iii. Acetaminophen (Tylenol, etc) 500-650mg  four times a day (every meal & bedtime) d. A  prescription for pain medication (such as oxycodone, hydrocodone, etc) should be given to you upon discharge.  Take your pain medication as prescribed.  i. If you are having problems/concerns with the prescription medicine (does not control pain, nausea, vomiting, rash, itching, etc), please call us [phone] to see if we need to switch you to a different pain medicine that will work better for you and/or control your side effect better. ii. If you need a refill on your pain medication, please contact your pharmacy.  They will contact our office to request authorization. Prescriptions will not be filled after 5 pm or on week-ends. 4. Avoid getting constipated.  Between the surgery and the pain medications, it is common to experience some constipation.  Increasing fluid intake and taking a fiber supplement (such as Metamucil, Citrucel, FiberCon, MiraLax, etc) 1-2 times a day regularly will usually help prevent this problem from occurring.  A mild laxative (prune juice, Milk of Magnesia, MiraLax, etc) should be taken according to package directions if there are no bowel movements after 48 hours.   5. Wash / shower every day.  You may shower over the  dressings as they are waterproof.  Continue to shower over incision(s) after the dressing is off. 6. Remove your waterproof bandages 5 days after surgery.  You may leave the incision open to air.  You may have skin tapes (Steri Strips) covering the incision(s).  Leave them on until one week, then remove.  You may replace a dressing/Band-Aid to cover the incision for comfort if you wish.      7. ACTIVITIES as tolerated:   a. You may resume regular (light) daily activities beginning the next day-such as daily self-care, walking, climbing stairs-gradually increasing activities as tolerated.  If you can walk 30 minutes without difficulty, it is safe to try more intense activity such as jogging, treadmill, bicycling, low-impact aerobics, swimming, etc. b. Save the most intensive and strenuous activity for last such as sit-ups, heavy lifting, contact sports, etc  Refrain from any heavy lifting or straining until you are off narcotics for pain control.   c. DO NOT PUSH THROUGH PAIN.  Let pain be your guide: If it hurts to do something, don't do it.  Pain is your body warning you to avoid that activity for another week until the pain goes down. d. You may drive when you are no longer taking prescription pain medication, you can comfortably wear a seatbelt, and you can safely maneuver your car and apply brakes. e. Bonita Quin may have sexual intercourse when it is comfortable.  8. FOLLOW UP in our office a. Please call [facility] at [phone] to set up an appointment to see your surgeon in the office for a follow-up appointment approximately 2-3 weeks after your surgery. b. Make sure that you call for this appointment the day you arrive home to insure a convenient appointment time. 9. IF YOU HAVE DISABILITY OR FAMILY LEAVE FORMS, BRING THEM TO THE OFFICE FOR PROCESSING.  DO NOT GIVE THEM TO YOUR DOCTOR.   WHEN TO CALL us [phone]: 1. Poor pain control 2. Reactions / problems with new medications  (rash/itching, nausea, etc)  3. Fever over 101.5 F (38.5 C) 4. Worsening swelling or bruising 5. Continued bleeding from incision. 6. Increased pain, redness, or drainage from the incision 7. Difficulty breathing / swallowing   The clinic staff is available to answer your questions during regular business hours (8:30am-5pm).  Please don't hesitate to call and ask to speak to one of our nurses for clinical concerns.   If you have a medical emergency, go to the nearest emergency room or  call 911.  A surgeon from [facility] is always on call at the [facility], [place] [address], [address], [place], [place]  [address] ? MAIN: [phone] ? TOLL FREE: [phone] ?  FAX [phone] [email or web address]

## 2013-04-23 ENCOUNTER — Telehealth: Payer: Self-pay | Admitting: *Deleted

## 2013-04-23 DIAGNOSIS — C184 Malignant neoplasm of transverse colon: Secondary | ICD-10-CM

## 2013-04-23 NOTE — Telephone Encounter (Signed)
Left VM asking when he needs to make appointment to have his labs checked again. Called back and left detailed voice mail that MD suggests checking Cmet again 3 months from 03/22/13 to evaluate his liver enzymes. He may do this here or per his PCP. This will be due in mid March. He may call our scheduler to set this up or call his PCP and request it. Will also send My Chart message to him.

## 2013-04-25 ENCOUNTER — Encounter (INDEPENDENT_AMBULATORY_CARE_PROVIDER_SITE_OTHER): Payer: Self-pay | Admitting: Surgery

## 2013-04-25 ENCOUNTER — Ambulatory Visit (INDEPENDENT_AMBULATORY_CARE_PROVIDER_SITE_OTHER): Payer: BC Managed Care – PPO | Admitting: Surgery

## 2013-04-25 VITALS — BP 142/82 | HR 74 | Resp 16 | Ht 67.0 in | Wt 181.0 lb

## 2013-04-25 DIAGNOSIS — Z452 Encounter for adjustment and management of vascular access device: Secondary | ICD-10-CM

## 2013-04-25 DIAGNOSIS — C184 Malignant neoplasm of transverse colon: Secondary | ICD-10-CM

## 2013-04-25 DIAGNOSIS — Z85038 Personal history of other malignant neoplasm of large intestine: Secondary | ICD-10-CM

## 2013-04-25 NOTE — Patient Instructions (Signed)
GENERAL SURGERY: POST OP INSTRUCTIONS  1. DIET: Follow a light bland diet the first 24 hours after arrival home, such as soup, liquids, crackers, etc.  Be sure to include lots of fluids daily.  Avoid fast food or heavy meals as your are more likely to get nauseated.   2. Take your usually prescribed home medications unless otherwise directed. 3. PAIN CONTROL: a. Pain is best controlled by a usual combination of three different methods TOGETHER: i. Ice/Heat ii. Over the counter pain medication iii. Prescription pain medication b. Most patients will experience some swelling and bruising around the incisions.  Ice packs or heating pads (30-60 minutes up to 6 times a day) will help. Use ice for the first few days to help decrease swelling and bruising, then switch to heat to help relax tight/sore spots and speed recovery.  Some people prefer to use ice alone, heat alone, alternating between ice & heat.  Experiment to what works for you.  Swelling and bruising can take several weeks to resolve.   c. It is helpful to take an over-the-counter pain medication regularly for the first few weeks.  Choose one of the following that works best for you: i. Naproxen (Aleve, etc)  Two 274m tabs twice a day ii. Ibuprofen (Advil, etc) Three 2085mtabs four times a day (every meal & bedtime) iii. Acetaminophen (Tylenol, etc) 500-65021mour times a day (every meal & bedtime) d. A  prescription for pain medication (such as oxycodone, hydrocodone, etc) should be given to you upon discharge.  Take your pain medication as prescribed.  i. If you are having problems/concerns with the prescription medicine (does not control pain, nausea, vomiting, rash, itching, etc), please call us [phone] see if we need to switch you to a different pain medicine that will work better for you and/or control your side effect better. ii. If you need a refill on your pain medication, please contact your pharmacy.  They will contact our  office to request authorization. Prescriptions will not be filled after 5 pm or on week-ends. 4. Avoid getting constipated.  Between the surgery and the pain medications, it is common to experience some constipation.  Increasing fluid intake and taking a fiber supplement (such as Metamucil, Citrucel, FiberCon, MiraLax, etc) 1-2 times a day regularly will usually help prevent this problem from occurring.  A mild laxative (prune juice, Milk of Magnesia, MiraLax, etc) should be taken according to package directions if there are no bowel movements after 48 hours.   5. Wash / shower every day.  You may shower over the dressings as they are waterproof.  Continue to shower over incision(s) after the dressing is off. 6. Remove your waterproof bandages 5 days after surgery.  You may leave the incision open to air.  You may have skin tapes (Steri Strips) covering the incision(s).  Leave them on until one week, then remove.  You may replace a dressing/Band-Aid to cover the incision for comfort if you wish.      7. ACTIVITIES as tolerated:   a. You may resume regular (light) daily activities beginning the next day-such as daily self-care, walking, climbing stairs-gradually increasing activities as tolerated.  If you can walk 30 minutes without difficulty, it is safe to try more intense activity such as jogging, treadmill, bicycling, low-impact aerobics, swimming, etc. b. Save the most intensive and strenuous activity for last such as sit-ups, heavy lifting, contact sports, etc  Refrain from any heavy lifting or straining until you  are off narcotics for pain control.   c. DO NOT PUSH THROUGH PAIN.  Let pain be your guide: If it hurts to do something, don't do it.  Pain is your body warning you to avoid that activity for another week until the pain goes down. d. You may drive when you are no longer taking prescription pain medication, you can comfortably wear a seatbelt, and you can safely maneuver your car and apply  brakes. e. Dennis Bast may have sexual intercourse when it is comfortable.  8. FOLLOW UP in our office a. Please call [facility] at [phone] to set up an appointment to see your surgeon in the office for a follow-up appointment approximately 2-3 weeks after your surgery. b. Make sure that you call for this appointment the day you arrive home to insure a convenient appointment time. 9. IF YOU HAVE DISABILITY OR FAMILY LEAVE FORMS, BRING THEM TO THE OFFICE FOR PROCESSING.  DO NOT GIVE THEM TO YOUR DOCTOR.   WHEN TO CALL us [phone]: 1. Poor pain control 2. Reactions / problems with new medications (rash/itching, nausea, etc)  3. Fever over 101.5 F (38.5 C) 4. Worsening swelling or bruising 5. Continued bleeding from incision. 6. Increased pain, redness, or drainage from the incision 7. Difficulty breathing / swallowing   The clinic staff is available to answer your questions during regular business hours (8:30am-5pm).  Please don't hesitate to call and ask to speak to one of our nurses for clinical concerns.   If you have a medical emergency, go to the nearest emergency room or call 911.  A surgeon from [facility] is always on call at the [facility], [place], [place], [place], [place]  [address] ? MAIN: [phone] ? TOLL FREE: [phone] ?  FAX [phone] [email or web address]  Managing Pain  Pain after surgery or related to activity is often due to strain/injury to muscle, tendon, nerves and/or incisions.  This pain is usually short-term and will improve in a few months.   Many people find it helpful to do the following things TOGETHER to help speed the process of healing and to get back to regular activity more quickly:  1. Avoid heavy physical activity a.  no lifting greater than 20 pounds b. Do not "push through" the pain.  Listen to your body and avoid positions and maneuvers than reproduce the  pain c. Walking is okay as tolerated, but go slowly and stop when getting sore.  d. Remember: If it hurts to do it, then don't do it! 2. Take Anti-inflammatory medication  a. Take with food/snack around the clock for 1-2 weeks i. This helps the muscle and nerve tissues become less irritable and calm down faster b. Choose ONE of the following over-the-counter medications: i. Naproxen 220mg  tabs (ex. Aleve) 1-2 pills twice a day  ii. Ibuprofen 200mg  tabs (ex. Advil, Motrin) 3-4 pills with every meal and just before bedtime iii. Acetaminophen 500mg  tabs (Tylenol) 1-2 pills with every meal and just before bedtime 3. Use a Heating pad or Ice/Cold Pack a. 4-6 times a day b. May use warm bath/hottub  or showers 4. Try Gentle Massage and/or Stretching  a. at the area of pain many times a day b. stop if you feel pain - do not overdo it  Try these steps together to help you body heal faster and avoid making things get worse.  Doing just one of these things may not be  enough.    If you are not getting better after two weeks or are noticing you are getting worse, contact our office for further advice; we may need to re-evaluate you & see what other things we can do to help.  Colorectal Cancer Colorectal cancer is an abnormal growth of tissue (tumor) in the colon or rectum that is cancerous (malignant). Unlike noncancerous (benign) tumors, malignant tumors can spread to other parts of your body. The colon is the large bowel or large intestine. The rectum is the last several inches of the colon.  RISK FACTORS The exact cause of colorectal cancer is unknown. However, the following factors may increase your chances of getting colorectal cancer:   Age older than 77 years.   Abnormal growths (polyps) on the inner wall of the colon or rectum.   Diabetes.   African American race.   Family history of hereditary nonpolyposis colorectal cancer. This condition is caused by changes in the genes that are  responsible for repairing mismatched DNA.   Personal history of cancer. A person who has already had colorectal cancer may develop it a second time. Also, women with a history of ovarian, uterine, or breast cancer are at a somewhat higher risk of developing colorectal cancer.  Certain hereditary conditions.  Eating a diet that is high in fat (especially animal fat) and low in fiber, fruits, and vegetables.  Sedentary lifestyle.  Inflammatory bowel disease, including ulcerative colitis and Crohn disease.   Smoking.   Excessive alcohol use.  SYMPTOMS Early colorectal cancer often does not cause symptoms. As the cancer grows, symptoms may include:   Changes in bowel habits.  Diarrhea.   Constipation.   Feeling like the bowel does not empty completely after a bowel movement.   Blood in the stool.   Stools that are narrower than usual.   Abdominal discomfort, pain, bloating, fullness, or cramps.  Frequent gas pain.   Unexplained weight loss.   Constant tiredness.   Nausea and vomiting.  DIAGNOSIS  Your health care provider will ask about your medical history. He or she may also perform a number of procedures, such as:   A physical exam.  A digital rectal exam.  A fecal occult blood test.  A barium enema.  Blood tests.   X-rays.   Imaging tests, such as CT scans or MRIs.   Taking a tissue sample (biopsy) from your colon or rectum to look for cancer cells.   A sigmoidoscopy to view the inside of the last part of your colon.   A colonoscopy to view the inside of your entire colon.   An endorectal ultrasound to see how deep a rectal tumor has grown and whether the cancer has spread to lymph nodes or other nearby tissues.  Your cancer will be staged to determine its severity and extent. Staging is a careful attempt to find out the size of the tumor, whether the cancer has spread, and if so, to what parts of the body. You may need to have more  tests to determine the stage of your cancer. The test results will help determine what treatment plan is best for you.   Stage 0 The cancer is found only in the innermost lining of the colon or rectum.   Stage I The cancer has grown into the inner wall of the colon or rectum. The cancer has not yet reached the outer wall of the colon.   Stage II The cancer extends more deeply into or through  the wall of the colon or rectum. It may have invaded nearby tissue, but cancer cells have not spread to the lymph nodes.   Stage III The cancer has spread to nearby lymph nodes but not to other parts of the body.   Stage IV The cancer has spread to other parts of the body, such as the liver or lungs.  Your health care provider may tell you the detailed stage of your cancer, which includes both a number and a letter.  TREATMENT  Depending on the type and stage, colorectal cancer may be treated with surgery, radiation therapy, chemotherapy, targeted therapy, or radiofrequency ablation. Some people have a combination of these therapies. Surgery may be done to remove the polyps from your colon. In early stages, your health care provider may be able to do this during a colonoscopy. In later stages, surgery may be done to remove part of your colon.  HOME CARE INSTRUCTIONS   Only take over-the-counter or prescription medicines for pain, discomfort, or fever as directed by your health care provider.   Maintain a healthy diet.   Consider joining a support group. This may help you learn to cope with the stress of having colorectal cancer.   Seek advice to help you manage treatment of side effects.   Keep all follow-up appointments as directed by your health care provider.   Inform your cancer specialist if you are admitted to the hospital.  SEEK MEDICAL CARE IF:  Your diarrhea or constipation does not go away.   Your bowel habits change.  You have increased abdominal pain.   You notice new  fatigue or weakness.  You lose weight. Document Released: 03/29/2005 Document Revised: 11/29/2012 Document Reviewed: 09/21/2012 [facility] Patient Information 2014 [place].

## 2013-04-25 NOTE — Progress Notes (Signed)
Patient ID: Jason Bray, male   DOB: [birth year], 58 y.o.   MRN: [id number]  Subjective:     Patient ID: Jason Bray, male   DOB: 1956-01-27, 58 y.o.   MRN: [id number]  HPI  Jason Bray  Aug 01, 1955 [id number]  Patient Care Team: Jason Miyamoto, Bray as PCP - General (Family Medicine) Jason Ng, Bray as Consulting Physician (Gastroenterology) Jason Bray as Consulting Physician (General Surgery) Jason Hector, Bray as Consulting Physician (Cardiology) Jason Pier, Bray as Consulting Physician (Hematology and Oncology)  This patient is a 58 y.o.male who presents today for surgical evaluation s/p surgery 08/25/2012  POST-OPERATIVE DIAGNOSIS: cancer hepatic flexure and polyp mid transverse colon   PROCEDURE: Procedure(s):  LAPAROSCOPIC PARTIAL COLECTOMY   SURGEON: Surgeon(s):  Jason Bray  Jason Bray - Asst   Patient comes today feeling well.  He completed post adjuvant chemotherapy through this port a catheter.  He did have some tingling and cold intolerance in his fingers.  Mild numbness at the left ball of his feet.  The neuropathy has remained stable since completing chemotherapy last month.  Otherwise in good spirits.  He is eating well.  Energy level is good.  No fevers or chills.  CEA was normal.  LFTs mildly elevated but not severe.  3 month followup tube.  He is sent to me by Jason Bray to have the porta catheter removed since chemotherapy has been completed.  The patient is interested in proceeding with this as well.  Had no further questions and wished to proceed.  Patient Active Problem List   Diagnosis Date Noted  . Peripheral neuropathy, secondary to chemotherapy drugs  04/09/2013  . Cancer of transverse colon s/p partial colectomy pT3,pN1a (1/28),pMX 08/25/2012 08/09/2012  . Adenomatous colon polyp - mid transverse colon s/p removal 08/09/2012  . HYPERLIPIDEMIA 07/09/2009  . ANXIETY 07/09/2009  . CHEST PAIN 07/09/2009     Past Medical History  Diagnosis Date  . Hyperlipidemia   . Anxiety   . GERD (gastroesophageal reflux disease)   . Cancer     cancerous colon polyp  . Anemia     Past Surgical History  Procedure Laterality Date  . Cardiac catheterization  06/2009    non cardiac  . Colonoscopy    . Laparoscopic partial colectomy N/A 08/25/2012    Procedure: LAPAROSCOPIC PARTIAL COLECTOMY  ;  Surgeon: Jason Bray;  Location: [facility];  Service: General;  Laterality: N/A;  . Portacath placement N/A 09/22/2012    Procedure: INSERTION PORT-A-CATH UNDER ULTRASOUND & FLUROSCOPIC GUIDANCE;  Surgeon: Jason Bray;  Location: [facility];  Service: General;  Laterality: N/A;    History   Social History  . Marital Status: Married    Spouse Name: Jason Bray    Number of Children: 3  . Years of Education: N/A   Occupational History  . Government social research officer    Social History Main Topics  . Smoking status: Former Smoker -- 1.00 packs/day for 20 years    Types: Cigarettes    Quit date: 01/19/2007  . Smokeless tobacco: Former Systems developer    Types: Chew    Quit date: 01/18/1998  . Alcohol Use: 0.0 oz/week     Comment: weekly   . Drug Use: No  . Sexual Activity: Not on file   Other Topics Concern  . 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 Lymphoma Mother   . Hypertension Father   . Hyperlipidemia Father   . Hypertension Brother     Current Outpatient Prescriptions  Medication Sig Dispense Refill  . metoprolol succinate (TOPROL-XL) 50 MG 24 hr tablet Take 50 mg by mouth at bedtime. Take with or immediately following a meal.       No current facility-administered medications for this visit.     No Known Allergies  BP 142/82  Pulse 74  Resp 16  Ht '5\' 7"'  (1.702 m)  Wt 181 lb (82.101 kg)  BMI 28.34  kg/m2  Dg Chest Port 1 View  09/22/2012   *RADIOLOGY REPORT*  Clinical Data: The Port-A-Cath placement.  PORTABLE CHEST - 1 VIEW  Comparison: The chest radiograph 06/17/2009  Findings: Interval placement of a right power port with the tip 6.5 cm below the carina at the level of the cavoatrial junction.  No evidence of pneumothorax.  No pleural fluid.  No pulmonary edema. Cardiac silhouettes is normal  IMPRESSION: Interval placement of a right power port with the tip at or just below the cavoatrial junction.  Findings discussed with Dr. Johney Bray on 09/22/2012 at 1400 hours .   Original Report Authenticated By: Jason Bray, M.D.   Dg C-arm 1-60 Min-no Report  09/22/2012   CLINICAL DATA: portacath   C-ARM 1-60 MINUTES  Fluoroscopy was utilized by the requesting physician.  No radiographic  interpretation.     Diagnosis Colon, segmental resection for tumor, proximal - INVASIVE MODERATELY DIFFERENTIATED ADENOCARCINOMA, INVADING THROUGH THE MUSCULARIS PROPRIA INTO PERICOLONIC FATTY TISSUE; NO EVIDENCE OF ANGIOLYMPHATIC INVASION IDENTIFIED. - A LARGE TUBULAR ADENOMA WITH NO EVIDENCE OF HIGH GRADE DYSPLASIA OR MALIGNANCY (1.7 CM). - ONE OF TWENTY-EIGHT LYMPH NODES, POSITIVE FOR METASTATIC CARCINOMA WITH EXTRANODAL EXTENSION (1/28) - RESECTION MARGINS, NEGATIVE FOR MALIGNANCY. 1 of 3 FINAL for Jason Bray ([id number]) Microscopic Comment COLON AND RECTUM Specimen: Proximal colon Procedure: Segmental resection Tumor site: Transverse colon Specimen integrity: Intact Macroscopic intactness of mesorectum: N/A Macroscopic tumor perforation: No Invasive tumor: Maximum size: 6.0 cm, Pocahontas Cohenour measurement Histologic type(s): Invasive adenocarcinoma Histologic grade and differentiation: G2: moderately differentiated/low grade Type of polyp in which invasive carcinoma arose: N/A Microscopic extension of invasive tumor: Invading through the muscularis propria into pericolonic fatty  tissue. Lymph-Vascular invasion: Not identified Peri-neural invasion: Not identified Tumor deposit(s) (discontinuous extramural extension): N/A Resection margins: Negative Proximal margin: 20 cm Distal margin: 21 cm Circumferential (radial) (posterior ascending, posterior descending; lateral and posterior mid-rectum; and entire lower 1/3 rectum): 4.5 cm Treatment effect (neoadjuvant therapy): No Number of lymph nodes examined 28; Number positive 1 Additional polyp(s): A large tubular adenoma (1.7 cm) with no evidence of high grade dysplasia or malignancy Non-neoplastic findings: N/A Pathologic Staging: pT3,pN1a,pMX Ancillary studies: A tumor block will be sent out for MSI testing and an addendum report will follow. (HCL:kh 08/29/18) Aldona Bar Bray Pathologist, Electronic Signature (Case signed 08/28/2012) Specimen Shara Hartis and Clinical Information Specimen(s) Obtained: Colon, segmental resection for tumor, proximal Specimen Clinical Information cancer hepatic flexor polyp mid transverse colon (kp) Taylr Meuth Specimen: Proximal colon, received in formalin Specimen integrity: Intact Specimen length: The specimen consists of 10 cm of terminal ileum and 35 cm of colon Tumor location: The tumor is located in the ascending colon and is circumferential. Tumor size: The tumor consists of a 6 x 3.8  cm sessile, ulcerated mass which has a maximum thickness of 1 cm. Percent of bowel circumference involved: 100% Tumor distance to margins: Proximal: 20 cm Distal: 21 cm Radial (posterior ascending, posterior descending; lateral and posterior mid-rectum; and entire lower 1/3 rectum): 4.5 cm 2 of 3 FINAL for EMMANUELL, KANTZ ([id number]) Kriya Westra(continued) Macroscopic extent of tumor invasion: Tumor invades through the muscularis propria into the subserosal adipose tissue but does not involve the serosa. Total presumed lymph nodes: There are 26 rubbery tan red ovoid nodules tentatively identified  as lymph nodes varying in size from 0.3 to 0.8 cm in greatest dimensions. Extramural satellite tumor nodules: Not grossly identified. Mucosal polyp(s): 5 cm from the distal margin there is a 1.7 x 1.5 x 1 cm sessile, rubbery mucosal polyp. Additional findings: The uninvolved mucosa is glistening and tan. The appendix is present and measures 4.2 cm in length x 0.6 cm in diameter. Block summary: Fifteen blocks submitted A = proximal margin B = distal margin C = proximal tumor to uninvolved mucosa transition D = distal tumor to uninvolved mucosa transition E = deep tumor extension F = tumor G = tissue for molecular testing H, I = mucosal polyp J = appendix K-O = lymph nodes (GRP:kh 08-28-12) Report signed out from the following location(s) [facility]. [address],[place] [address].PJAS:[id number]., [place], [place],  [address]. CLIA #: [id number], 3 of    Assessment:                 Review of Systems  Constitutional: Negative for fever, chills and diaphoresis.  HENT: Negative for ear discharge, facial swelling, mouth sores, nosebleeds, sore throat and trouble swallowing.   Eyes: Negative for photophobia, discharge and visual disturbance.  Respiratory: Negative for choking, chest tightness, shortness of breath and stridor.   Cardiovascular: Negative for chest pain and palpitations.  Gastrointestinal: Negative for nausea, vomiting, abdominal pain, diarrhea, constipation, blood in stool, abdominal distention, anal bleeding and rectal pain.  Endocrine: Negative for cold intolerance and heat intolerance.  Genitourinary: Negative for dysuria, urgency, difficulty urinating and testicular pain.  Musculoskeletal: Negative for arthralgias, back pain, gait problem and myalgias.  Skin: Negative for color change, pallor, rash and wound.  Allergic/Immunologic: Negative for environmental allergies and food  allergies.  Neurological: Negative for dizziness, speech difficulty, weakness, numbness and headaches.  Hematological: Negative for adenopathy. Does not bruise/bleed easily.  Psychiatric/Behavioral: Negative for hallucinations, confusion and agitation.       Objective:   Physical Exam  Constitutional: He is oriented to person, place, and time. He appears well-developed and well-nourished. No distress.  HENT:  Head: Normocephalic.  Mouth/Throat: Oropharynx is clear and moist. No oropharyngeal exudate.  Eyes: Conjunctivae and EOM are normal. Pupils are equal, round, and reactive to light. No scleral icterus.  Neck: Normal range of motion. Neck supple. No tracheal deviation present.  Cardiovascular: Normal rate, regular rhythm and intact distal pulses.   Pulmonary/Chest: Effort normal and breath sounds normal. No respiratory distress.    Abdominal: Soft. He exhibits no distension. There is no tenderness. Hernia confirmed negative in the right inguinal area and confirmed negative in the left inguinal area.  Musculoskeletal: Normal range of motion. He exhibits no tenderness.  Lymphadenopathy:    He has no cervical adenopathy.       Right: No inguinal adenopathy present.       Left: No inguinal adenopathy present.  Neurological: He is alert and oriented to person, place, and time.  No cranial nerve deficit. He exhibits normal muscle tone. Coordination normal.  Skin: Skin is warm and dry. No rash noted. He is not diaphoretic. No erythema. No pallor.  Psychiatric: He has a normal mood and affect. His behavior is normal. Judgment and thought content normal.         Assessment:     Recovering well status post resection for node-positive cancer of hepatic flexure, NED 7 months ago.  6 weeks s/p completion of chemoTx    Plan:     I removed his Port-A-Catheter:  The technique risks benefits and alternatives of portacatheter removal was discussed.  Questions answered.  The patient agreed to  proceed.  The patient was positioned supine.  The chest was prepped and draped in a sterile fashion.  I placed a field block of local anesthetic.  The patient tolerated it well.  I made a transverse incision over the prior scar of the portacatheter.  I was able to sharply encounter the SQ tissue pocket around the portacatheter.  I was able to free the attachments of the portacatheter to the chest wall, releasing the stitches holding it in place.   With that I could remove the port and the catheter.  I held pressure.  Hemostasis was good.  I closed the wound using absorbable suture.  Sterile dressing applied.  The patient tolerated the procedure well.  I discussed postoperative care.  Questions were answered.  One-year followup colonoscopy to rule out new polyps/cancers.  May 2015.  I encouraged him to call his gastroenterology and make sure that that is happening  Return to clinic as needed.   Instructions discussed.  Followup with primary care physician for other health issues as would normally be done.  Questions answered.  The patient expressed understanding and appreciation

## 2013-07-12 ENCOUNTER — Encounter (INDEPENDENT_AMBULATORY_CARE_PROVIDER_SITE_OTHER): Payer: Self-pay | Admitting: Surgery

## 2013-08-20 ENCOUNTER — Other Ambulatory Visit (HOSPITAL_BASED_OUTPATIENT_CLINIC_OR_DEPARTMENT_OTHER): Payer: BC Managed Care – PPO

## 2013-08-20 ENCOUNTER — Ambulatory Visit (HOSPITAL_COMMUNITY)
Admission: RE | Admit: 2013-08-20 | Discharge: 2013-08-20 | Disposition: A | Discharge status: Home / Self Care | Payer: BC Managed Care – PPO | Source: Ambulatory Visit | Attending: Oncology | Admitting: Oncology

## 2013-08-20 ENCOUNTER — Encounter (HOSPITAL_COMMUNITY): Payer: Self-pay

## 2013-08-20 DIAGNOSIS — C182 Malignant neoplasm of ascending colon: Secondary | ICD-10-CM

## 2013-08-20 DIAGNOSIS — D509 Iron deficiency anemia, unspecified: Secondary | ICD-10-CM

## 2013-08-20 DIAGNOSIS — C184 Malignant neoplasm of transverse colon: Secondary | ICD-10-CM | POA: Insufficient documentation

## 2013-08-20 LAB — BASIC METABOLIC PANEL (CC13)
Anion Gap: 12 mEq/L — ABNORMAL HIGH (ref 3–11)
BUN: 11.9 mg/dL (ref 7.0–26.0)
CHLORIDE: 102 meq/L (ref 98–109)
CO2: 26 mEq/L (ref 22–29)
Calcium: 10.3 mg/dL (ref 8.4–10.4)
Creatinine: 0.9 mg/dL (ref 0.7–1.3)
GLUCOSE: 107 mg/dL (ref 70–140)
POTASSIUM: 4 meq/L (ref 3.5–5.1)
Sodium: 140 mEq/L (ref 136–145)

## 2013-08-20 LAB — CEA: CEA: 2 ng/mL (ref 0.0–5.0)

## 2013-08-20 MED ORDER — IOHEXOL 300 MG/ML  SOLN
100.0000 mL | Freq: Once | INTRAMUSCULAR | Status: AC | PRN
  Administered 2013-08-20: 100 mL via INTRAVENOUS

## 2013-08-23 ENCOUNTER — Telehealth: Payer: Self-pay | Admitting: Oncology

## 2013-08-23 ENCOUNTER — Ambulatory Visit (HOSPITAL_BASED_OUTPATIENT_CLINIC_OR_DEPARTMENT_OTHER): Payer: BC Managed Care – PPO | Admitting: Oncology

## 2013-08-23 ENCOUNTER — Ambulatory Visit (HOSPITAL_BASED_OUTPATIENT_CLINIC_OR_DEPARTMENT_OTHER): Payer: BC Managed Care – PPO

## 2013-08-23 VITALS — BP 147/80 | HR 80 | Temp 98.1°F | Resp 20 | Ht 67.0 in | Wt 177.3 lb

## 2013-08-23 DIAGNOSIS — C184 Malignant neoplasm of transverse colon: Secondary | ICD-10-CM

## 2013-08-23 DIAGNOSIS — R7989 Other specified abnormal findings of blood chemistry: Secondary | ICD-10-CM

## 2013-08-23 LAB — COMPREHENSIVE METABOLIC PANEL (CC13)
ALK PHOS: 99 U/L (ref 40–150)
ALT: 58 U/L — AB (ref 0–55)
AST: 40 U/L — AB (ref 5–34)
Albumin: 4.3 g/dL (ref 3.5–5.0)
Anion Gap: 13 mEq/L — ABNORMAL HIGH (ref 3–11)
BUN: 19.8 mg/dL (ref 7.0–26.0)
CALCIUM: 9.8 mg/dL (ref 8.4–10.4)
CHLORIDE: 105 meq/L (ref 98–109)
CO2: 24 mEq/L (ref 22–29)
Creatinine: 0.9 mg/dL (ref 0.7–1.3)
Glucose: 98 mg/dl (ref 70–140)
POTASSIUM: 4.4 meq/L (ref 3.5–5.1)
Sodium: 141 mEq/L (ref 136–145)
Total Bilirubin: 1.25 mg/dL — ABNORMAL HIGH (ref 0.20–1.20)
Total Protein: 8.1 g/dL (ref 6.4–8.3)

## 2013-08-23 NOTE — Progress Notes (Signed)
  Jason Bray OFFICE PROGRESS NOTE   Diagnosis: Colon cancer  INTERVAL HISTORY:   He returns as scheduled. He feels well. Numbness at the distal fingers and toes has progressed following completion of chemotherapy. This does not interfere with activity. The Port-A-Cath was removed 04/25/2013.  Objective:  Vital signs in last 24 hours:  Blood pressure 147/80, pulse 80, temperature 98.1 F (36.7 C), temperature source Oral, resp. rate 20, height _0  (1.702 m), weight 177 lb 4.8 oz (80.423 kg), SpO2 100.00%.    HEENT: Neck without mass Lymphatics: No cervical, supra-clavicular, axillary, or inguinal nodes Resp: Lungs clear bilaterally Cardio: Regular in rhythm GI: No hepatosplenomegaly, nontender, no mass Vascular: No leg edema   Lab Results:   Lab Results  Component Value Date   CEA 2.0 08/20/2013    Imaging:  CTs of the chest, abdomen, and pelvis 08/20/2013: Negative for metastatic disease. Medications: I have reviewed the patient's current medications.  Assessment/Plan: 1. Stage III (T3 N1) adenocarcinoma of the ascending colon status post partial colectomy 08/25/2012. The tumor is microsatellite stable and there was no loss of mismatch repair protein expression.  He began cycle 1 of adjuvant CAPOX on 09/29/2012.  He completed cycle 2 beginning 10/20/2012.  Cycle 3 beginning on 11/10/2012  Cycle 4 beginning 12/01/2012.  Cycle 5 12/22/2012  Cycle 6-Xeloda only, 01/12/2013  Cycle 7 02/02/2013  Cycle 8 02/23/2013 CTs of the chest, abdomen, and pelvis 08/20/2013-negative 2. Transverse colon polyp removed in the colectomy specimen 08/25/2012. 3. Iron deficiency anemia. The hemoglobin and MCV corrected into normal range. 4. Mild elevation of the liver enzymes-likely related to chemotherapy. The simvastatin has been on hold. We will followup on the liver panel from today.  5.   oxaliplatin neuropath   Disposition:  He is in clinical remission from  colon cancer. We will refer him to Dr. Michail Sermon for a one-year surveillance colonoscopy. Hopefully the neuropathy symptoms will improve over the next few months. Jason Bray will return for an office visit and CEA in 6 months.  He will see Dr. Maceo Pro to discuss management of the hyperlipidemia.  Jason Pier, MD  08/23/2013  9:17 AM

## 2013-08-23 NOTE — Telephone Encounter (Signed)
Faxed pt medical records to Dr. Michail Sermon

## 2013-08-23 NOTE — Telephone Encounter (Signed)
gave pt appt for lab and MD , sent pt to labs today, called [facility] and they will call pt for appt, referrral taken to HIM

## 2013-08-24 ENCOUNTER — Telehealth: Payer: Self-pay | Admitting: *Deleted

## 2013-08-24 NOTE — Telephone Encounter (Signed)
Left VM for patient to call office re: lab results. Routed copy to Dr. Briscoe Deutscher.

## 2013-08-24 NOTE — Telephone Encounter (Signed)
Message copied by Tania Ade on Fri Aug 24, 2013 10:32 AM ------      Message from: [name] B      Created: Thu Aug 23, 2013  8:13 PM       Please call patient, liver enzymes are still mildly elevated, likely due to chemotherapy, repeat next visit, f/u with Dr. Maceo Pro      Copy to Dr. Maceo Pro ------

## 2013-08-30 NOTE — Telephone Encounter (Signed)
Per Dr. Benay Spice; notified pt liver enzymes are still mildly elevated, likely due to chemotherapy, office will repeat lab next visit, f/u with Dr. Maceo Pro.  Pt states he has appt with Dr. Maceo Pro next week.  Pt verbalized understanding and confirmed appt 02/22/14.

## 2014-02-22 ENCOUNTER — Telehealth: Payer: Self-pay | Admitting: Oncology

## 2014-02-22 ENCOUNTER — Ambulatory Visit (HOSPITAL_BASED_OUTPATIENT_CLINIC_OR_DEPARTMENT_OTHER): Payer: BC Managed Care – PPO | Admitting: Oncology

## 2014-02-22 ENCOUNTER — Other Ambulatory Visit (HOSPITAL_BASED_OUTPATIENT_CLINIC_OR_DEPARTMENT_OTHER): Payer: BC Managed Care – PPO

## 2014-02-22 ENCOUNTER — Telehealth: Payer: Self-pay | Admitting: *Deleted

## 2014-02-22 VITALS — BP 138/94 | HR 74 | Temp 97.7°F | Resp 18 | Ht 67.0 in | Wt 184.2 lb

## 2014-02-22 DIAGNOSIS — R748 Abnormal levels of other serum enzymes: Secondary | ICD-10-CM

## 2014-02-22 DIAGNOSIS — G620 Drug-induced polyneuropathy: Secondary | ICD-10-CM

## 2014-02-22 DIAGNOSIS — C184 Malignant neoplasm of transverse colon: Secondary | ICD-10-CM

## 2014-02-22 LAB — COMPREHENSIVE METABOLIC PANEL (CC13)
ALK PHOS: 119 U/L (ref 40–150)
ALT: 111 U/L — ABNORMAL HIGH (ref 0–55)
ANION GAP: 9 meq/L (ref 3–11)
AST: 57 U/L — ABNORMAL HIGH (ref 5–34)
Albumin: 4.4 g/dL (ref 3.5–5.0)
BUN: 18.8 mg/dL (ref 7.0–26.0)
CO2: 27 meq/L (ref 22–29)
Calcium: 10 mg/dL (ref 8.4–10.4)
Chloride: 105 mEq/L (ref 98–109)
Creatinine: 0.9 mg/dL (ref 0.7–1.3)
GLUCOSE: 94 mg/dL (ref 70–140)
Potassium: 4.2 mEq/L (ref 3.5–5.1)
SODIUM: 141 meq/L (ref 136–145)
TOTAL PROTEIN: 8.3 g/dL (ref 6.4–8.3)
Total Bilirubin: 0.84 mg/dL (ref 0.20–1.20)

## 2014-02-22 NOTE — Telephone Encounter (Signed)
-----   Message from Ladell Pier, MD sent at 02/22/2014  1:38 PM EST ----- Copy lab to Dr. Maceo Pro, he may want to recheck lfts since he is on a statin or w/u further Doubt related to chemo. Or cancer

## 2014-02-22 NOTE — Telephone Encounter (Signed)
Routed copy of CMET to Dr. Maceo Pro.

## 2014-02-22 NOTE — Telephone Encounter (Signed)
Pt Pt confirmed labs/ov per 11/13 POF, gave pt AVS..... KJ pt req to p/u contrast day before labs...Marland KitchenMarland Kitchen

## 2014-02-22 NOTE — Progress Notes (Signed)
  [place] OFFICE PROGRESS NOTE   Diagnosis: colon cancer  INTERVAL HISTORY:   He returns as scheduled. He continues to have very mild numbness in the fingers and a full feeling at the distal soles of the feet. This does not interfere with activity. He feels well. Occasional "burning" discomfort in the right groin.he is now taking a total of a statin for hyperlipidemia.  Objective:  Vital signs in last 24 hours:  There were no vitals taken for this visit.    HEENT: neck without mass Lymphatics: no cervical, supraclavicular, axillary, or inguinal nodes Resp: lungs clear bilaterally Cardio: regular rate and rhythm GI: no hepatosplenomegaly, nontender, no mass, no apparent right inguinal hernia Vascular: no leg edema   Lab Results:02/22/2014-alkaline phosphatase 119, albumin 4.4, AST 57, ALT 111, bilirubin 0.84    Lab Results  Component Value Date   CEA 2.0 08/20/2013    Medications: I have reviewed the patient's current medications.  Assessment/Plan: 1. Stage III (T3 N1) adenocarcinoma of the ascending colon status post partial colectomy 08/25/2012. The tumor is microsatellite stable and there was no loss of mismatch repair protein expression.  He began cycle 1 of adjuvant CAPOX on 09/29/2012.   He completed cycle 2 beginning 10/20/2012.   Cycle 3 beginning on 11/10/2012   Cycle 4 beginning 12/01/2012.   Cycle 5 12/22/2012   Cycle 6-Xeloda only, 01/12/2013   Cycle 7 02/02/2013   Cycle 8 02/23/2013  CTs of the chest, abdomen, and pelvis 08/20/2013-negative  Negative surveillance colonoscopy 09/21/2013 2. Transverse colon polyp removed in the colectomy specimen 08/25/2012. 3. Iron deficiency anemia. The hemoglobin and MCV corrected into normal range. 4.   Mild elevation of the liver enzymes-potentially related to atorvastatin, evaluation per Dr. Maceo Bray  5.oxaliplatin neuropathy-Persistent    Disposition:   Jason Bray remains in  clinical remission from colon cancer. He will return for an office visit, CEA, and surveillance CT scans in 6 months. He will continue surveillance colonoscopies with Dr. Michail Bray.  He will see Dr. Maceo Bray to evaluate the elevated liver enzymes.  Jason Coder, MD  02/22/2014  9:56 AM

## 2014-02-23 LAB — CEA: CEA: 1.9 ng/mL (ref 0.0–5.0)

## 2014-07-01 ENCOUNTER — Telehealth: Payer: Self-pay | Admitting: Oncology

## 2014-07-01 NOTE — Telephone Encounter (Signed)
07/01/14 faxed medical records to [phone] [facility].

## 2014-08-22 ENCOUNTER — Ambulatory Visit (HOSPITAL_COMMUNITY)
Admission: RE | Admit: 2014-08-22 | Discharge: 2014-08-22 | Disposition: A | Discharge status: Home / Self Care | Payer: BLUE CROSS/BLUE SHIELD | Source: Ambulatory Visit | Attending: Oncology | Admitting: Oncology

## 2014-08-22 ENCOUNTER — Other Ambulatory Visit (HOSPITAL_BASED_OUTPATIENT_CLINIC_OR_DEPARTMENT_OTHER): Payer: BLUE CROSS/BLUE SHIELD

## 2014-08-22 ENCOUNTER — Encounter (HOSPITAL_COMMUNITY): Payer: Self-pay

## 2014-08-22 DIAGNOSIS — C189 Malignant neoplasm of colon, unspecified: Secondary | ICD-10-CM | POA: Diagnosis not present

## 2014-08-22 DIAGNOSIS — C184 Malignant neoplasm of transverse colon: Secondary | ICD-10-CM | POA: Diagnosis not present

## 2014-08-22 LAB — CBC WITH DIFFERENTIAL/PLATELET
BASO%: 0.3 % (ref 0.0–2.0)
Basophils Absolute: 0 10*3/uL (ref 0.0–0.1)
EOS%: 1.1 % (ref 0.0–7.0)
Eosinophils Absolute: 0.1 10*3/uL (ref 0.0–0.5)
HEMATOCRIT: 45.4 % (ref 38.4–49.9)
HGB: 15.4 g/dL (ref 13.0–17.1)
LYMPH%: 26.5 % (ref 14.0–49.0)
MCH: 29.4 pg (ref 27.2–33.4)
MCHC: 33.9 g/dL (ref 32.0–36.0)
MCV: 86.8 fL (ref 79.3–98.0)
MONO#: 0.7 10*3/uL (ref 0.1–0.9)
MONO%: 9.4 % (ref 0.0–14.0)
NEUT#: 4.7 10*3/uL (ref 1.5–6.5)
NEUT%: 62.7 % (ref 39.0–75.0)
Platelets: 322 10*3/uL (ref 140–400)
RBC: 5.23 10*6/uL (ref 4.20–5.82)
RDW: 13.6 % (ref 11.0–14.6)
WBC: 7.5 10*3/uL (ref 4.0–10.3)
lymph#: 2 10*3/uL (ref 0.9–3.3)

## 2014-08-22 LAB — COMPREHENSIVE METABOLIC PANEL (CC13)
ALT: 58 U/L — ABNORMAL HIGH (ref 0–55)
AST: 41 U/L — ABNORMAL HIGH (ref 5–34)
Albumin: 4.5 g/dL (ref 3.5–5.0)
Alkaline Phosphatase: 93 U/L (ref 40–150)
Anion Gap: 13 mEq/L — ABNORMAL HIGH (ref 3–11)
BILIRUBIN TOTAL: 0.82 mg/dL (ref 0.20–1.20)
BUN: 12 mg/dL (ref 7.0–26.0)
CO2: 27 mEq/L (ref 22–29)
CREATININE: 0.8 mg/dL (ref 0.7–1.3)
Calcium: 10.3 mg/dL (ref 8.4–10.4)
Chloride: 102 mEq/L (ref 98–109)
GLUCOSE: 99 mg/dL (ref 70–140)
Potassium: 4.3 mEq/L (ref 3.5–5.1)
Sodium: 142 mEq/L (ref 136–145)
Total Protein: 8.4 g/dL — ABNORMAL HIGH (ref 6.4–8.3)

## 2014-08-22 LAB — CEA: CEA: 1.4 ng/mL (ref 0.0–5.0)

## 2014-08-22 MED ORDER — IOHEXOL 300 MG/ML  SOLN
100.0000 mL | Freq: Once | INTRAMUSCULAR | Status: AC | PRN
  Administered 2014-08-22: 100 mL via INTRAVENOUS

## 2014-08-23 ENCOUNTER — Telehealth: Payer: Self-pay | Admitting: *Deleted

## 2014-08-23 NOTE — Telephone Encounter (Signed)
-----   Message from Ladell Pier, MD sent at 08/23/2014  4:09 PM EDT ----- Please call patient, CTs negative for cancer

## 2014-08-23 NOTE — Telephone Encounter (Signed)
Called pt with CT results: negative for cancer. He voiced understanding, viewed result on MyChart.

## 2014-08-26 ENCOUNTER — Telehealth: Payer: Self-pay | Admitting: Oncology

## 2014-08-26 ENCOUNTER — Ambulatory Visit (HOSPITAL_BASED_OUTPATIENT_CLINIC_OR_DEPARTMENT_OTHER): Payer: BLUE CROSS/BLUE SHIELD | Admitting: Oncology

## 2014-08-26 VITALS — BP 148/83 | HR 76 | Temp 98.1°F | Resp 18 | Ht 67.0 in | Wt 175.7 lb

## 2014-08-26 DIAGNOSIS — C184 Malignant neoplasm of transverse colon: Secondary | ICD-10-CM

## 2014-08-26 DIAGNOSIS — R748 Abnormal levels of other serum enzymes: Secondary | ICD-10-CM | POA: Diagnosis not present

## 2014-08-26 DIAGNOSIS — G620 Drug-induced polyneuropathy: Secondary | ICD-10-CM | POA: Diagnosis not present

## 2014-08-26 NOTE — Telephone Encounter (Signed)
Gave and printed appt sched and avs fo rpt; for NOV  °

## 2014-08-26 NOTE — Progress Notes (Addendum)
Jason Bray OFFICE PROGRESS NOTE   Diagnosis: Colon cancer  INTERVAL HISTORY:   He returns as scheduled. He feels well. He is working a night shift. He continues to have neuropathy symptoms in the feet. No neuropathy symptoms in the hands. Good appetite.  Objective:  Vital signs in last 24 hours:  Blood pressure 148/83, pulse 76, temperature 98.1 F (36.7 C), temperature source Oral, resp. rate 18, height 5' 7" (1.702 m), weight 175 lb 11.2 oz (79.697 kg), SpO2 99 %.    HEENT: Neck without mass Lymphatics: No cervical, supraclavicular, axillary, or inguinal nodes Resp: Lungs clear bilaterally Cardio: Regular rate and rhythm GI: No hepatomegaly, nontender, no mass Vascular: No leg edema   Lab Results:  Lab Results  Component Value Date   WBC 7.5 08/22/2014   HGB 15.4 08/22/2014   HCT 45.4 08/22/2014   MCV 86.8 08/22/2014   PLT 322 08/22/2014   NEUTROABS 4.7 08/22/2014   Creatinine 0.8, AST 41, ALT 58, bilirubin 0.82   Lab Results  Component Value Date   CEA 1.4 08/22/2014    Imaging:  Ct Chest W Contrast  08/22/2014   CLINICAL DATA:  Colon cancer. Chemotherapy complete. Restaging. Diagnosed in 2014. Transverse colonic primary.  EXAM: CT CHEST, ABDOMEN, AND PELVIS WITH CONTRAST  TECHNIQUE: Multidetector CT imaging of the chest, abdomen and pelvis was performed following the standard protocol during bolus administration of intravenous contrast.  CONTRAST:  111m OMNIPAQUE IOHEXOL 300 MG/ML  SOLN  COMPARISON:  08/20/2013  FINDINGS: CT CHEST FINDINGS  Mediastinum/Nodes: No supraclavicular adenopathy. Aortic and branch vessel atherosclerosis. Normal heart size, without pericardial effusion. LAD coronary artery atherosclerosis. No central pulmonary embolism, on this non-dedicated study. No mediastinal or hilar adenopathy.  Lungs/Pleura: No pleural fluid.  Clear lungs.  Musculoskeletal: No acute osseous abnormality.  CT ABDOMEN PELVIS FINDINGS  Hepatobiliary:  Similar medial segment left liver lobe too small to characterize lesion on image 53, favored to represent a cyst. More cephalad left liver lobe cyst is unchanged at 1.9 cm.  Similar too small to characterize high lateral segment left liver lobe lesion on image 48.  Vague hyper enhancement in the posterior hepatic dome on transverse image 49 and coronal image 61 is new. Right hepatic lobe hyper enhancement, most apparent on coronal image 49 is felt to be similar. Normal gallbladder, without biliary ductal dilatation.  Pancreas: Normal, without mass or ductal dilatation.  Spleen: Normal  Adrenals/Urinary Tract: Normal adrenal glands. Normal kidneys, without hydronephrosis. Normal urinary bladder.  Stomach/Bowel: Normal stomach, without wall thickening. Right hemicolectomy. Normal small bowel.  Vascular/Lymphatic: Aortic and branch vessel atherosclerosis. No abdominopelvic adenopathy.  Reproductive: Normal prostate.  Other: No significant free fluid. No evidence of omental or peritoneal disease.  Musculoskeletal: Scattered bone islands, including the left iliac. Bilateral L5 pars defects. Lumbar degenerative disc disease with prominent disc bulge at the lumbosacral junction.  IMPRESSION: 1. Right  hemicolectomy, without recurrent or metastatic disease. 2.  Atherosclerosis, including within the coronary arteries. 3. Two foci of hepatic hyper enhancement. The more inferior right hepatic lobe area is felt to be similar. The more cephalad, posterior hepatic dome area is likely new. Not typical of hepatic metastasis. Favored to represent benign entities such as perfusion anomalies. Recommend attention on follow-up. 4. L5 pars defects with degenerative disc disease at the lumbosacral junction.   Electronically Signed   By: KAbigail MiyamotoM.D.   On: 08/22/2014 11:40   Ct Abdomen Pelvis W Contrast  08/22/2014   CLINICAL  DATA:  Colon cancer. Chemotherapy complete. Restaging. Diagnosed in 2014. Transverse colonic primary.   EXAM: CT CHEST, ABDOMEN, AND PELVIS WITH CONTRAST  TECHNIQUE: Multidetector CT imaging of the chest, abdomen and pelvis was performed following the standard protocol during bolus administration of intravenous contrast.  CONTRAST:  110m OMNIPAQUE IOHEXOL 300 MG/ML  SOLN  COMPARISON:  08/20/2013  FINDINGS: CT CHEST FINDINGS  Mediastinum/Nodes: No supraclavicular adenopathy. Aortic and branch vessel atherosclerosis. Normal heart size, without pericardial effusion. LAD coronary artery atherosclerosis. No central pulmonary embolism, on this non-dedicated study. No mediastinal or hilar adenopathy.  Lungs/Pleura: No pleural fluid.  Clear lungs.  Musculoskeletal: No acute osseous abnormality.  CT ABDOMEN PELVIS FINDINGS  Hepatobiliary: Similar medial segment left liver lobe too small to characterize lesion on image 53, favored to represent a cyst. More cephalad left liver lobe cyst is unchanged at 1.9 cm.  Similar too small to characterize high lateral segment left liver lobe lesion on image 48.  Vague hyper enhancement in the posterior hepatic dome on transverse image 49 and coronal image 61 is new. Right hepatic lobe hyper enhancement, most apparent on coronal image 49 is felt to be similar. Normal gallbladder, without biliary ductal dilatation.  Pancreas: Normal, without mass or ductal dilatation.  Spleen: Normal  Adrenals/Urinary Tract: Normal adrenal glands. Normal kidneys, without hydronephrosis. Normal urinary bladder.  Stomach/Bowel: Normal stomach, without wall thickening. Right hemicolectomy. Normal small bowel.  Vascular/Lymphatic: Aortic and branch vessel atherosclerosis. No abdominopelvic adenopathy.  Reproductive: Normal prostate.  Other: No significant free fluid. No evidence of omental or peritoneal disease.  Musculoskeletal: Scattered bone islands, including the left iliac. Bilateral L5 pars defects. Lumbar degenerative disc disease with prominent disc bulge at the lumbosacral junction.  IMPRESSION: 1.  Right  hemicolectomy, without recurrent or metastatic disease. 2.  Atherosclerosis, including within the coronary arteries. 3. Two foci of hepatic hyper enhancement. The more inferior right hepatic lobe area is felt to be similar. The more cephalad, posterior hepatic dome area is likely new. Not typical of hepatic metastasis. Favored to represent benign entities such as perfusion anomalies. Recommend attention on follow-up. 4. L5 pars defects with degenerative disc disease at the lumbosacral junction.   Electronically Signed   By: KAbigail MiyamotoM.D.   On: 08/22/2014 11:40    Medications: I have reviewed the patient's current medications.  Assessment/Plan: 1. Stage III (T3 N1) adenocarcinoma of the ascending colon status post partial colectomy 08/25/2012. The tumor is microsatellite stable and there was no loss of mismatch repair protein expression.  He began cycle 1 of adjuvant CAPOX on 09/29/2012.   He completed cycle 2 beginning 10/20/2012.   Cycle 3 beginning on 11/10/2012   Cycle 4 beginning 12/01/2012.   Cycle 5 12/22/2012   Cycle 6-Xeloda only, 01/12/2013   Cycle 7 02/02/2013   Cycle 8 02/23/2013  CTs of the chest, abdomen, and pelvis 08/20/2013-negative  Negative surveillance colonoscopy 09/21/2013  CT 08/22/2014, new area of vague hyper enhancement in the posterior hepatic dome 2. Transverse colon polyp removed in the colectomy specimen 08/25/2012. 3. Iron deficiency anemia. The hemoglobin and MCV corrected into normal range. 4. Persistent Mild elevation of the liver enzymes-potentially related to atorvastatin, evaluation per Dr. 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 SMel Almondremains in clinical remission from colon cancer. He will return for an office visit and CEA in 6 months. The area of hyperenhancement on the 08/22/2014 CT is likely related to a perfusion anomaly. I will  review the CT and recommend  additional imaging as indicated.  Jason Bray, GARY, MD  08/26/2014  8:35 AM I reviewed the 08/22/2014 CT with a radiologist. There is a low clinical suspicion for the area of hyperenhancement at the hepatic dome to represent a malignancy. Short-term CT follow-up is not recommended. He will have a surveillance CT as planned at a one-year interval.   

## 2014-09-27 IMAGING — CT CT ABD-PELV W/ CM
4 of 5 series · 12 of 46 positions shown, 19 images · IV contrast (READICAT/WATER & [ID] OMNI 300)
Comparison: None.

CLINICAL DATA: New diagnosis of colon cancer.

CT ABDOMEN AND PELVIS WITH CONTRAST
TECHNIQUE: Multidetector CT imaging of the abdomen and pelvis was
performed following the standard protocol during bolus
administration of intravenous contrast.
Contrast: 100mL OMNIPAQUE IOHEXOL 300 MG/ML  SOLN

[Series 2: chest/abd/pelvis · axial · 0.70mm/px · z∈[-529,-309]mm · 4 of 119 slices shown]
[im 8/119  soft-tissue]
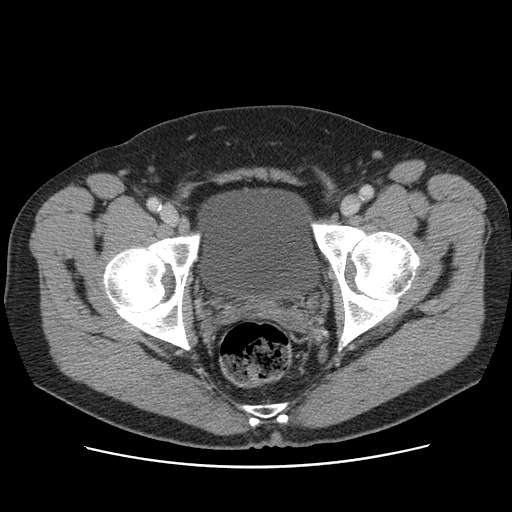
[im 23/119  soft-tissue]
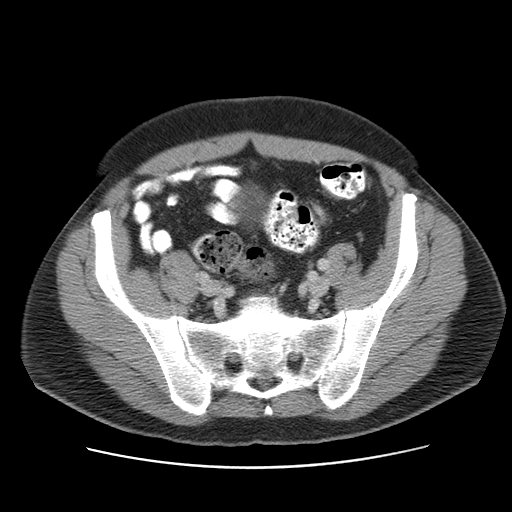
[im 37/119  soft-tissue]
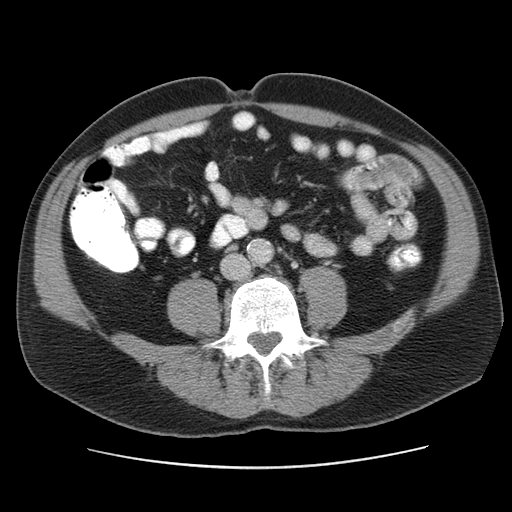
[im 52/119  soft-tissue]
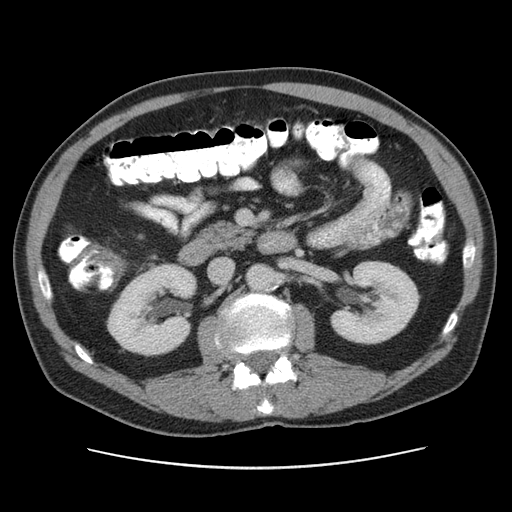

[Series 5: renal delays · axial · 0.70mm/px · z∈[-345,-210]mm · 4 of 45 slices shown, 9 images]
[im 9/45  soft-tissue]
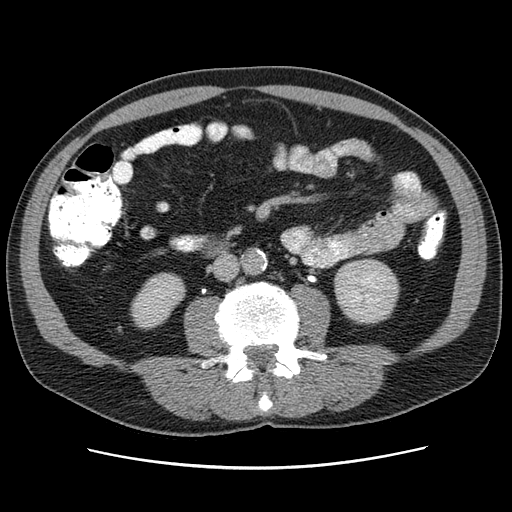
[im 9/45  lung]
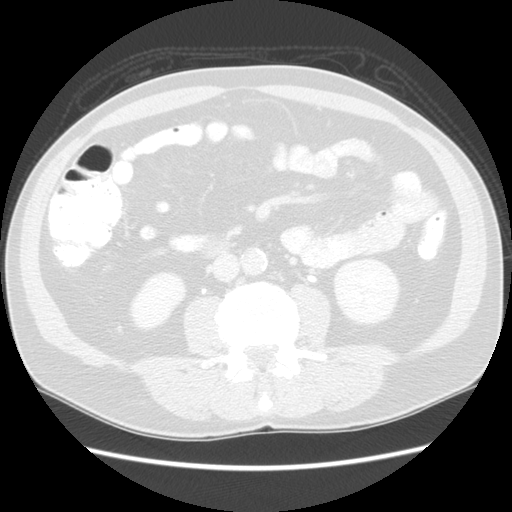
[im 9/45  bone]
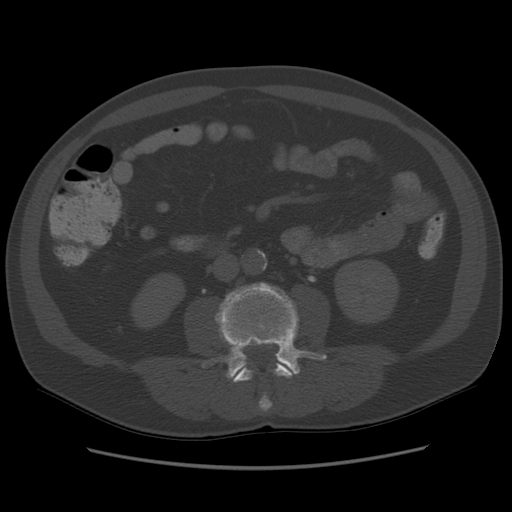
[im 18/45  soft-tissue]
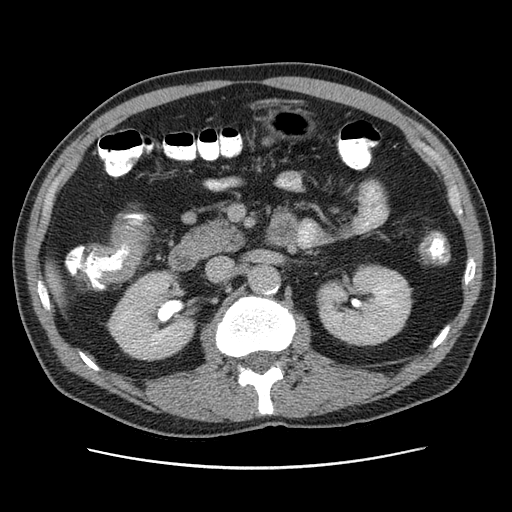
[im 18/45  lung]
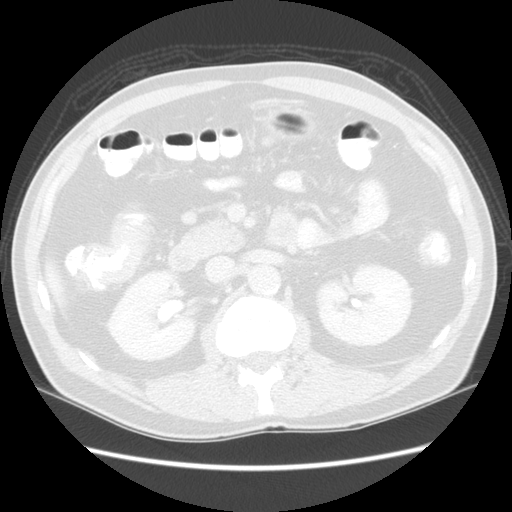
[im 27/45  soft-tissue]
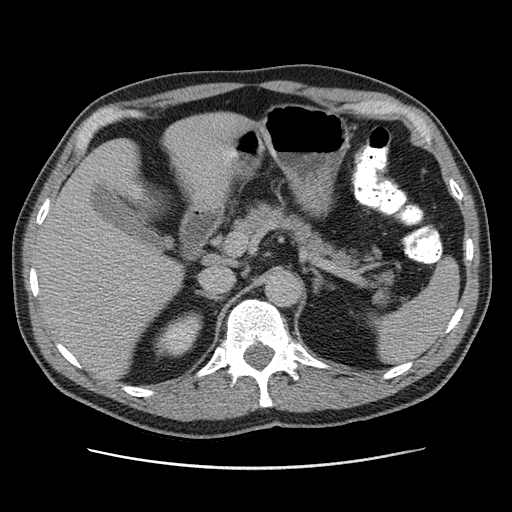
[im 27/45  lung]
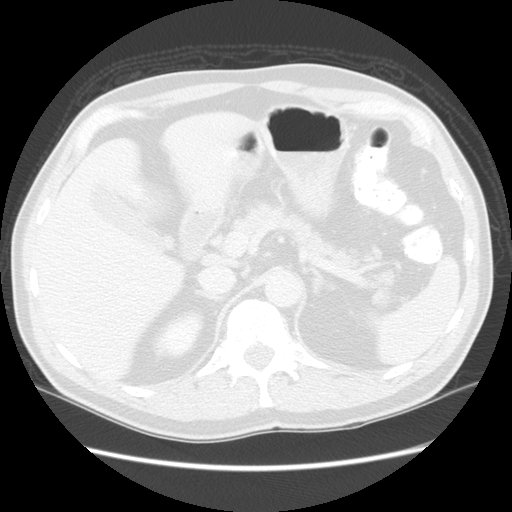
[im 36/45  soft-tissue]
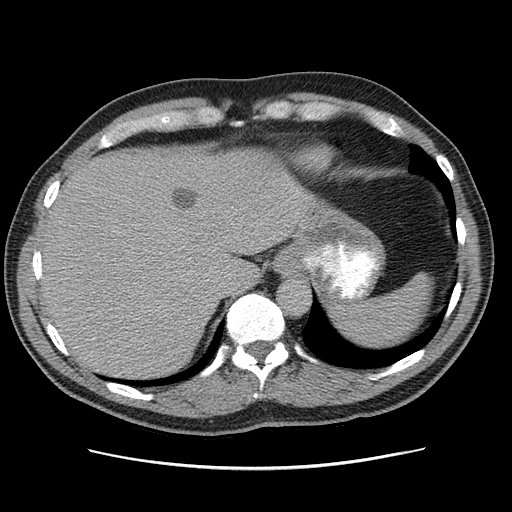
[im 36/45  lung]
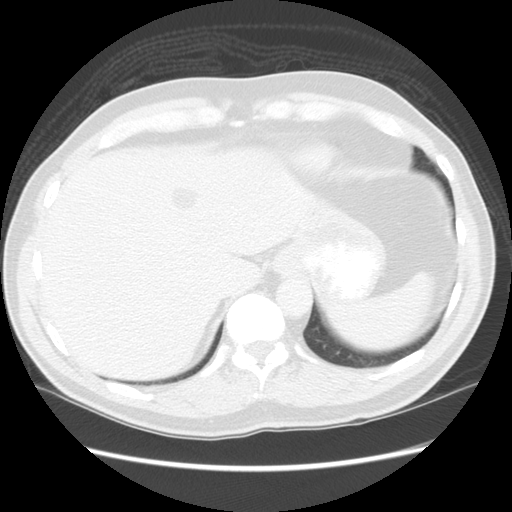

[Series 401: corr · coronal · 1.30mm/px · 3 of 129 slices shown, 4 images]
[im 43/129  soft-tissue]
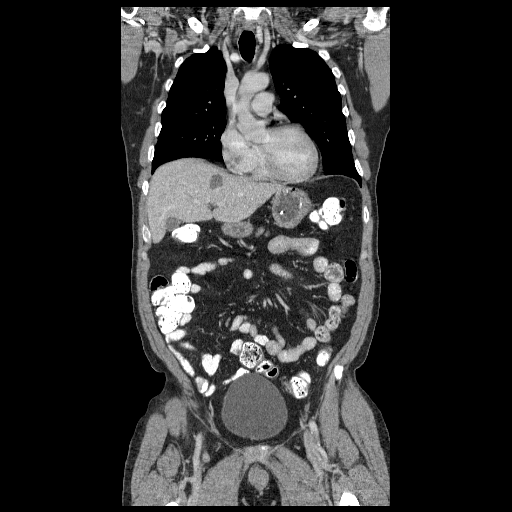
[im 57/129  soft-tissue]
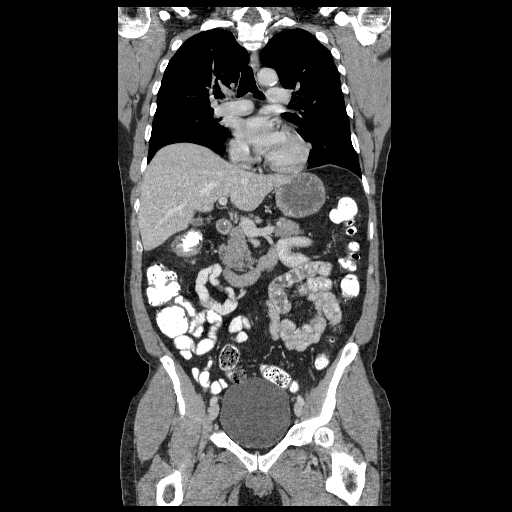
[im 57/129  bone]
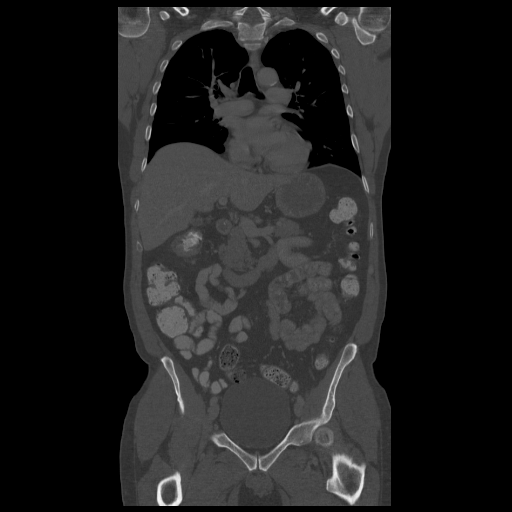
[im 72/129  soft-tissue]
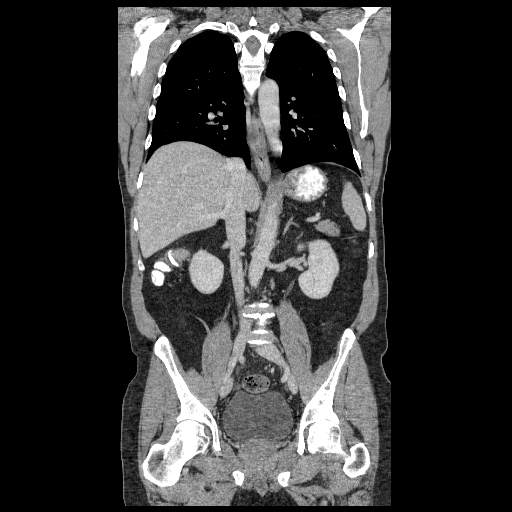

[Series 402: sag · sagittal · 1.30mm/px · 1 of 177 slices shown, 2 images]
[im 59/177  soft-tissue]
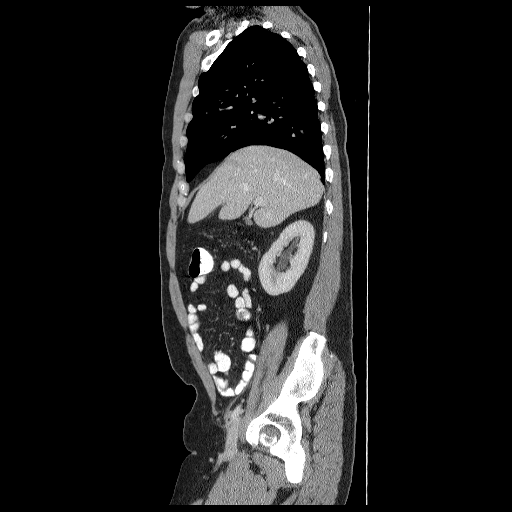
[im 59/177  bone]
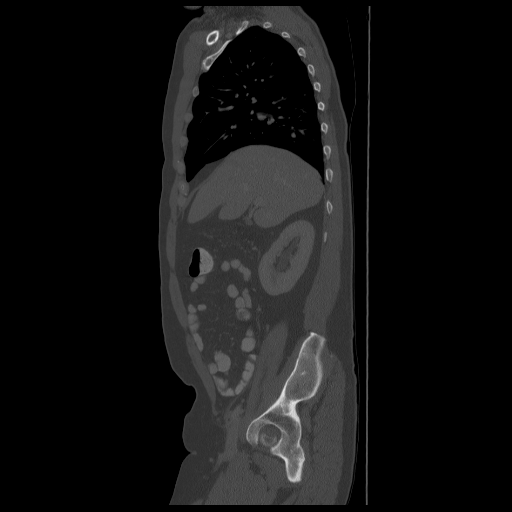

[12 of 46 positions shown; findings below may reference images not displayed]

FINDINGS: There is an annular carcinoma of the hepatic flexure of
the colon  5.5 cm in length.  There is a single slightly prominent
mesenteric lymph node adjacent to the tumor, measuring 11 x 9 x 10
mm, visible on image number 66 of series 2. There are a few tiny
adjacent mesenteric lymph nodes as well.

No evidence of metastatic disease to the liver or other organs.  No
osseous metastases.

There is a 2.4 cm cyst in the dome of the left lobe of the liver.
There are two tiny cysts in the anterior aspect of the left lobe.

The spleen, pancreas, adrenal glands, and kidneys are normal. There
is no periaortic adenopathy.  Atherosclerotic disease in the
abdominal aorta.

Small bowel is normal including the terminal ileum.  Appendix is
normal.

No free air or free fluid.

No acute osseous abnormality.  Bilateral pars defects at L5 without
spondylolisthesis.
IMPRESSION: 1.  Annular carcinoma of the hepatic flexure of the colon.
2.  Single enlarged adjacent mesenteric lymph node.
3.  No other evidence of metastatic disease.

## 2015-02-24 ENCOUNTER — Telehealth: Payer: Self-pay | Admitting: Oncology

## 2015-02-24 ENCOUNTER — Other Ambulatory Visit (HOSPITAL_BASED_OUTPATIENT_CLINIC_OR_DEPARTMENT_OTHER): Payer: BLUE CROSS/BLUE SHIELD

## 2015-02-24 ENCOUNTER — Ambulatory Visit (HOSPITAL_BASED_OUTPATIENT_CLINIC_OR_DEPARTMENT_OTHER): Payer: BLUE CROSS/BLUE SHIELD | Admitting: Nurse Practitioner

## 2015-02-24 VITALS — BP 157/84 | HR 77 | Temp 98.3°F | Resp 18 | Ht 67.0 in | Wt 185.3 lb

## 2015-02-24 DIAGNOSIS — C184 Malignant neoplasm of transverse colon: Secondary | ICD-10-CM

## 2015-02-24 DIAGNOSIS — D509 Iron deficiency anemia, unspecified: Secondary | ICD-10-CM | POA: Diagnosis not present

## 2015-02-24 DIAGNOSIS — C182 Malignant neoplasm of ascending colon: Secondary | ICD-10-CM

## 2015-02-24 LAB — COMPREHENSIVE METABOLIC PANEL (CC13)
ALBUMIN: 4.2 g/dL (ref 3.5–5.0)
ALK PHOS: 82 U/L (ref 40–150)
ALT: 51 U/L (ref 0–55)
ANION GAP: 10 meq/L (ref 3–11)
AST: 33 U/L (ref 5–34)
BILIRUBIN TOTAL: 0.98 mg/dL (ref 0.20–1.20)
BUN: 13.3 mg/dL (ref 7.0–26.0)
CALCIUM: 9.8 mg/dL (ref 8.4–10.4)
CO2: 26 meq/L (ref 22–29)
CREATININE: 0.9 mg/dL (ref 0.7–1.3)
Chloride: 104 mEq/L (ref 98–109)
EGFR: >90 mL/min/{1.73_m2} (ref >90)
Glucose: 96 mg/dl (ref 70–140)
Potassium: 4.2 mEq/L (ref 3.5–5.1)
SODIUM: 141 meq/L (ref 136–145)
TOTAL PROTEIN: 7.9 g/dL (ref 6.4–8.3)

## 2015-02-24 NOTE — Telephone Encounter (Signed)
Gave and printed appt sched and avs fo rpt for May 2017 °

## 2015-02-24 NOTE — Progress Notes (Signed)
  [place] OFFICE PROGRESS NOTE   Diagnosis:  Colon cancer  INTERVAL HISTORY:   Mr. Regis returns as scheduled. He feels well. No change in bowel habits. No bloody or black stools. No abdominal pain. No nausea or vomiting. He has a good appetite.  Objective:  Vital signs in last 24 hours:  Blood pressure 157/84, pulse 77, temperature 98.3 F (36.8 C), temperature source Oral, resp. rate 18, height $RemoveBe'5\' 7"'tjCHRShae$  (1.702 m), weight 185 lb 4.8 oz (84.052 kg), SpO2 99 %.    HEENT: No thrush or ulcers. Lymphatics: No palpable cervical, supraclavicular, axillary or inguinal lymph nodes. Resp: Lungs clear bilaterally. Cardio: Regular rate and rhythm. GI: Abdomen soft and nontender. No hepatomegaly. No mass. Vascular: No leg edema.  Lab Results:  Lab Results  Component Value Date   WBC 7.5 08/22/2014   HGB 15.4 08/22/2014   HCT 45.4 08/22/2014   MCV 86.8 08/22/2014   PLT 322 08/22/2014   NEUTROABS 4.7 08/22/2014    Imaging:  No results found.  Medications: I have reviewed the patient's current medications.  Assessment/Plan: 1. Stage III (T3 N1) adenocarcinoma of the ascending colon status post partial colectomy 08/25/2012. The tumor is microsatellite stable and there was no loss of mismatch repair protein expression.  He began cycle 1 of adjuvant CAPOX on 09/29/2012.   He completed cycle 2 beginning 10/20/2012.   Cycle 3 beginning on 11/10/2012   Cycle 4 beginning 12/01/2012.   Cycle 5 12/22/2012   Cycle 6-Xeloda only, 01/12/2013   Cycle 7 02/02/2013   Cycle 8 02/23/2013  CTs of the chest, abdomen, and pelvis 08/20/2013-negative  Negative surveillance colonoscopy 09/21/2013  CT 08/22/2014, new area of vague hyper enhancement in the posterior hepatic dome 2. Transverse colon polyp removed in the colectomy specimen 08/25/2012. 3. Iron deficiency anemia. The hemoglobin and MCV corrected into normal range. 4. Persistent Mild elevation of the  liver enzymes-potentially related to atorvastatin, evaluation per Dr. Maceo Pro. Liver enzymes normal 02/24/2015. 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 Mel Almond remains in clinical remission from colon cancer. We will follow-up on the CEA from today. He will return for a follow-up visit in 6 months with labs and CT scans one to 2 days prior. He will contact the office in the interim with any problems.    Ned Card ANP/GNP-BC   02/24/2015  10:01 AM

## 2015-02-25 LAB — CEA: CEA: 1.2 ng/mL (ref 0.0–5.0)

## 2015-07-10 ENCOUNTER — Encounter: Payer: Self-pay | Admitting: Oncology

## 2015-08-07 ENCOUNTER — Telehealth: Payer: Self-pay | Admitting: Oncology

## 2015-08-07 ENCOUNTER — Other Ambulatory Visit: Payer: Self-pay | Admitting: *Deleted

## 2015-08-07 NOTE — Telephone Encounter (Signed)
cld & spoke to pt and gave pt time & date of lab r/s to 5/15 @ 8

## 2015-08-25 ENCOUNTER — Other Ambulatory Visit (HOSPITAL_BASED_OUTPATIENT_CLINIC_OR_DEPARTMENT_OTHER): Payer: 59

## 2015-08-25 ENCOUNTER — Encounter (HOSPITAL_COMMUNITY): Payer: Self-pay

## 2015-08-25 ENCOUNTER — Ambulatory Visit (HOSPITAL_COMMUNITY)
Admission: RE | Admit: 2015-08-25 | Discharge: 2015-08-25 | Disposition: A | Discharge status: Home / Self Care | Payer: 59 | Source: Ambulatory Visit | Attending: Nurse Practitioner | Admitting: Nurse Practitioner

## 2015-08-25 DIAGNOSIS — C184 Malignant neoplasm of transverse colon: Secondary | ICD-10-CM

## 2015-08-25 DIAGNOSIS — R160 Hepatomegaly, not elsewhere classified: Secondary | ICD-10-CM | POA: Diagnosis not present

## 2015-08-25 DIAGNOSIS — Z9049 Acquired absence of other specified parts of digestive tract: Secondary | ICD-10-CM | POA: Insufficient documentation

## 2015-08-25 LAB — COMPREHENSIVE METABOLIC PANEL
ALT: 57 U/L — AB (ref 0–55)
AST: 36 U/L — ABNORMAL HIGH (ref 5–34)
Albumin: 4.2 g/dL (ref 3.5–5.0)
Alkaline Phosphatase: 80 U/L (ref 40–150)
Anion Gap: 9 mEq/L (ref 3–11)
BILIRUBIN TOTAL: 0.91 mg/dL (ref 0.20–1.20)
BUN: 18.1 mg/dL (ref 7.0–26.0)
CO2: 24 meq/L (ref 22–29)
Calcium: 9.5 mg/dL (ref 8.4–10.4)
Chloride: 106 mEq/L (ref 98–109)
Creatinine: 0.9 mg/dL (ref 0.7–1.3)
GLUCOSE: 104 mg/dL (ref 70–140)
Potassium: 4.1 mEq/L (ref 3.5–5.1)
SODIUM: 139 meq/L (ref 136–145)
TOTAL PROTEIN: 8.1 g/dL (ref 6.4–8.3)

## 2015-08-25 LAB — CBC WITH DIFFERENTIAL/PLATELET
BASO%: 0.9 % (ref 0.0–2.0)
BASOS ABS: 0.1 10*3/uL (ref 0.0–0.1)
EOS%: 2.7 % (ref 0.0–7.0)
Eosinophils Absolute: 0.2 10*3/uL (ref 0.0–0.5)
HCT: 44.6 % (ref 38.4–49.9)
HEMOGLOBIN: 14.8 g/dL (ref 13.0–17.1)
LYMPH%: 32.5 % (ref 14.0–49.0)
MCH: 28.7 pg (ref 27.2–33.4)
MCHC: 33.3 g/dL (ref 32.0–36.0)
MCV: 86 fL (ref 79.3–98.0)
MONO#: 0.8 10*3/uL (ref 0.1–0.9)
MONO%: 10.9 % (ref 0.0–14.0)
NEUT#: 3.9 10*3/uL (ref 1.5–6.5)
NEUT%: 53 % (ref 39.0–75.0)
Platelets: 364 10*3/uL (ref 140–400)
RBC: 5.18 10*6/uL (ref 4.20–5.82)
RDW: 13.6 % (ref 11.0–14.6)
WBC: 7.3 10*3/uL (ref 4.0–10.3)
lymph#: 2.4 10*3/uL (ref 0.9–3.3)

## 2015-08-25 MED ORDER — IOPAMIDOL (ISOVUE-300) INJECTION 61%
100.0000 mL | Freq: Once | INTRAVENOUS | Status: AC | PRN
  Administered 2015-08-25: 100 mL via INTRAVENOUS

## 2015-08-26 ENCOUNTER — Other Ambulatory Visit: Payer: BLUE CROSS/BLUE SHIELD

## 2015-08-26 LAB — CEA (PARALLEL TESTING): CEA: 1.7 ng/mL

## 2015-08-26 LAB — CEA: CEA: 3.1 ng/mL (ref 0.0–4.7)

## 2015-08-28 ENCOUNTER — Telehealth: Payer: Self-pay | Admitting: Oncology

## 2015-08-28 ENCOUNTER — Ambulatory Visit (HOSPITAL_BASED_OUTPATIENT_CLINIC_OR_DEPARTMENT_OTHER): Payer: 59 | Admitting: Oncology

## 2015-08-28 VITALS — BP 152/77 | HR 76 | Temp 98.5°F | Resp 18 | Ht 67.0 in | Wt 185.6 lb

## 2015-08-28 DIAGNOSIS — D509 Iron deficiency anemia, unspecified: Secondary | ICD-10-CM | POA: Diagnosis not present

## 2015-08-28 DIAGNOSIS — R200 Anesthesia of skin: Secondary | ICD-10-CM

## 2015-08-28 DIAGNOSIS — C184 Malignant neoplasm of transverse colon: Secondary | ICD-10-CM

## 2015-08-28 NOTE — Progress Notes (Signed)
[place] OFFICE PROGRESS NOTE   Diagnosis: Colon cancer  INTERVAL HISTORY:   Jason Bray returns as scheduled. He feels well. Good appetite. No difficulty with bowel function. He is exercising. He continues to have numbness at the distal soles of the feet. This does not interfere with activity.   Objective:  Vital signs in last 24 hours:  Blood pressure 152/77, pulse 76, temperature 98.5 F (36.9 C), temperature source Oral, resp. rate 18, height _0  (1.702 m), weight 185 lb 9.6 oz (84.188 kg), SpO2 99 %.    HEENT: Neck without mass Lymphatics: No cervical, supra-clavicular, axillary, or inguinal nodes Resp: Lungs clear bilaterally Cardio: Regular rate and rhythm GI: No hepatosplenomegaly, no mass, nontender Vascular: No leg edema    Lab Results:  Lab Results  Component Value Date   WBC 7.3 08/25/2015   HGB 14.8 08/25/2015   HCT 44.6 08/25/2015   MCV 86.0 08/25/2015   PLT 364 08/25/2015   NEUTROABS 3.9 08/25/2015     Lab Results  Component Value Date   CEA1 3.1 08/25/2015    Imaging:  Ct Chest W Contrast  08/25/2015  CLINICAL DATA:  Follow-up multiple vague areas of hyper enhancement within the liver. History of colon cancer diagnosed 07/2012. EXAM: CT CHEST, ABDOMEN AND PELVIS WITHOUT CONTRAST TECHNIQUE: Multidetector CT imaging of the chest, abdomen and pelvis was performed following the standard protocol without IV contrast. COMPARISON:  CT CAP 08/22/2014 ; CT CAP 08/20/2013. FINDINGS: CT CHEST FINDINGS Mediastinum/Lymph Nodes: Visualized thyroid is unremarkable. No enlarged axillary, mediastinal or hilar lymphadenopathy. Normal heart size. No pericardial effusion. Aorta and main pulmonary artery are normal in caliber. Lungs/Pleura: Central airways are patent. No large area of pulmonary consolidation. No pleural effusion or pneumothorax. Musculoskeletal: No aggressive or acute appearing osseous lesions. CT ABDOMEN PELVIS FINDINGS Hepatobiliary:  The liver is normal in size and contour. Slight interval decrease in size of probable cyst measuring 1.7 cm within the left hepatic lobe (image 80 ; series 4). Two additional adjacent too small to characterize low-attenuation lesions within the left hepatic lobe are stable (image 85; series 4) and (image 82; series 4). Within the right hepatic lobe on arterial phase imaging there are multiple adjacent hyper enhancing masses measuring up to 1.9 cm (image 57; series 2). These are difficult to directly compare in size with prior CT given difference in phase of contrast enhancement. More anteriorly within the right hepatic lobe there is a hyperenhancing 1.0 cm mass (image 52; series 2). Near the hepatic dome there are at least 2 tiny hyper enhancing masses (image 32; series 2) and (image 24; series 2). Pancreas: Unremarkable Spleen: Unremarkable Adrenals/Urinary Tract: The adrenal glands are normal. The kidneys enhance symmetrically with contrast. No hydronephrosis. Urinary bladder is unremarkable. Stomach/Bowel: No abnormal bowel wall thickening or evidence for bowel obstruction. No free fluid or free intraperitoneal air. Postsurgical changes compatible with right hemicolectomy. Vascular/Lymphatic: Normal caliber abdominal aorta. Peripheral calcified atherosclerotic plaque. No retroperitoneal lymphadenopathy. Other: Prostate is unremarkable. Musculoskeletal: Lumbar spine degenerative changes. No aggressive or acute appearing osseous lesions. Bilateral L5 pars defects. IMPRESSION: On arterial phase imaging there are multiple hyper enhancing masses measuring up to 1.9 cm within the right hepatic lobe. No definite correlate is demonstrated within the liver on portal venous phase imaging. These are indeterminate in etiology however likely benign. Recommend dedicated evaluation of the liver with MRI. If MRI is not obtained, recommend follow-up multi phase CT in 3 months. Otherwise stable CT of the chest, abdomen  and pelvis  status post right hemicolectomy. Electronically Signed   By: Lovey Newcomer M.D.   On: 08/25/2015 10:18   Ct Abdomen Pelvis W Contrast  08/25/2015  CLINICAL DATA:  Follow-up multiple vague areas of hyper enhancement within the liver. History of colon cancer diagnosed 07/2012. EXAM: CT CHEST, ABDOMEN AND PELVIS WITHOUT CONTRAST TECHNIQUE: Multidetector CT imaging of the chest, abdomen and pelvis was performed following the standard protocol without IV contrast. COMPARISON:  CT CAP 08/22/2014 ; CT CAP 08/20/2013. FINDINGS: CT CHEST FINDINGS Mediastinum/Lymph Nodes: Visualized thyroid is unremarkable. No enlarged axillary, mediastinal or hilar lymphadenopathy. Normal heart size. No pericardial effusion. Aorta and main pulmonary artery are normal in caliber. Lungs/Pleura: Central airways are patent. No large area of pulmonary consolidation. No pleural effusion or pneumothorax. Musculoskeletal: No aggressive or acute appearing osseous lesions. CT ABDOMEN PELVIS FINDINGS Hepatobiliary: The liver is normal in size and contour. Slight interval decrease in size of probable cyst measuring 1.7 cm within the left hepatic lobe (image 80 ; series 4). Two additional adjacent too small to characterize low-attenuation lesions within the left hepatic lobe are stable (image 85; series 4) and (image 82; series 4). Within the right hepatic lobe on arterial phase imaging there are multiple adjacent hyper enhancing masses measuring up to 1.9 cm (image 57; series 2). These are difficult to directly compare in size with prior CT given difference in phase of contrast enhancement. More anteriorly within the right hepatic lobe there is a hyperenhancing 1.0 cm mass (image 52; series 2). Near the hepatic dome there are at least 2 tiny hyper enhancing masses (image 32; series 2) and (image 24; series 2). Pancreas: Unremarkable Spleen: Unremarkable Adrenals/Urinary Tract: The adrenal glands are normal. The kidneys enhance symmetrically with  contrast. No hydronephrosis. Urinary bladder is unremarkable. Stomach/Bowel: No abnormal bowel wall thickening or evidence for bowel obstruction. No free fluid or free intraperitoneal air. Postsurgical changes compatible with right hemicolectomy. Vascular/Lymphatic: Normal caliber abdominal aorta. Peripheral calcified atherosclerotic plaque. No retroperitoneal lymphadenopathy. Other: Prostate is unremarkable. Musculoskeletal: Lumbar spine degenerative changes. No aggressive or acute appearing osseous lesions. Bilateral L5 pars defects. IMPRESSION: On arterial phase imaging there are multiple hyper enhancing masses measuring up to 1.9 cm within the right hepatic lobe. No definite correlate is demonstrated within the liver on portal venous phase imaging. These are indeterminate in etiology however likely benign. Recommend dedicated evaluation of the liver with MRI. If MRI is not obtained, recommend follow-up multi phase CT in 3 months. Otherwise stable CT of the chest, abdomen and pelvis status post right hemicolectomy. Electronically Signed   By: Lovey Newcomer M.D.   On: 08/25/2015 10:18    Medications: I have reviewed the patient's current medications.  Assessment/Plan: 1. Stage III (T3 N1) adenocarcinoma of the ascending colon status post partial colectomy 08/25/2012. The tumor is microsatellite stable and there was no loss of mismatch repair protein expression.  He began cycle 1 of adjuvant CAPOX on 09/29/2012.   He completed cycle 2 beginning 10/20/2012.   Cycle 3 beginning on 11/10/2012   Cycle 4 beginning 12/01/2012.   Cycle 5 12/22/2012   Cycle 6-Xeloda only, 01/12/2013   Cycle 7 02/02/2013   Cycle 8 02/23/2013  CTs of the chest, abdomen, and pelvis 08/20/2013-negative  Negative surveillance colonoscopy 09/21/2013  CT 08/22/2014, new area of vague hyper enhancement in the posterior hepatic dome  CTs 08/25/2015-no evidence of recurrent colon cancer, indeterminate  hyperenhancing "masses "noted on arterial phase imaging in the right hepatic lobe 2.  Transverse colon polyp removed in the colectomy specimen 08/25/2012. 3. Iron deficiency anemia. The hemoglobin and MCV corrected into normal range. 4. Persistent Mild elevation of the liver enzymes-potentially related to atorvastatin, evaluation per Dr. Maceo Pro. Liver enzymes normal 02/24/2015. 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 He will return for an office visit and CEA in 6 months. I suspect the areas of hyperenhancement in the right liver are related to a benign finding. I will present his case at the GI tumor conference next week to review the CT and discuss the indication for additional imaging.  [name], MD  08/28/2015  9:12 AM

## 2015-08-28 NOTE — Telephone Encounter (Signed)
per pof to sch pt appt-gave pt copy of avs °

## 2015-09-04 ENCOUNTER — Telehealth: Payer: Self-pay | Admitting: *Deleted

## 2015-09-04 NOTE — Telephone Encounter (Signed)
Oncology Nurse Navigator Documentation  Oncology Nurse Navigator Flowsheets 09/04/2015  Navigator Location [facility]  Navigator Encounter Type Telephone  Telephone Outgoing Call;Patient Update  Time Spent with Patient 15   Notified patient that per tumor board discussion, the liver lesions on last scan have been present all along with no change. Not malignant. Were more prominent on recent scan due to being an arterial phase CT that made them more clear. There is no indication for any further imaging.

## 2016-02-27 ENCOUNTER — Other Ambulatory Visit (HOSPITAL_BASED_OUTPATIENT_CLINIC_OR_DEPARTMENT_OTHER): Payer: 59

## 2016-02-27 ENCOUNTER — Telehealth: Payer: Self-pay | Admitting: Oncology

## 2016-02-27 ENCOUNTER — Ambulatory Visit (HOSPITAL_BASED_OUTPATIENT_CLINIC_OR_DEPARTMENT_OTHER): Payer: 59 | Admitting: Nurse Practitioner

## 2016-02-27 VITALS — BP 147/85 | HR 72 | Temp 98.3°F | Resp 16 | Ht 67.0 in | Wt 188.3 lb

## 2016-02-27 DIAGNOSIS — C184 Malignant neoplasm of transverse colon: Secondary | ICD-10-CM

## 2016-02-27 DIAGNOSIS — C182 Malignant neoplasm of ascending colon: Secondary | ICD-10-CM | POA: Diagnosis not present

## 2016-02-27 LAB — CEA (IN HOUSE-CHCC): CEA (CHCC-IN HOUSE): 2.9 ng/mL (ref 0.00–5.00)

## 2016-02-27 NOTE — Telephone Encounter (Signed)
Appointments scheduled per 02/27/16 los. AVS and appointment schedule was given to patient,per 02/27/16 los.  °

## 2016-02-27 NOTE — Progress Notes (Signed)
  [facility] OFFICE PROGRESS NOTE   Diagnosis:  Colon cancer  INTERVAL HISTORY:   Jason Bray returns as scheduled. He feels well. No change in bowel habits. No bleeding with bowel movements. He denies abdominal pain. No nausea or vomiting. He has a good appetite. He continues to have numbness over the soles of the feet.  Objective:  Vital signs in last 24 hours:  Blood pressure (!) 147/85, pulse 72, temperature 98.3 F (36.8 C), temperature source Oral, resp. rate 16, height '5\' 7"'$  (1.702 m), weight 188 lb 4.8 oz (85.4 kg), SpO2 99 %.    HEENT: No neck mass. Lymphatics: No palpable cervical, supra clavicular, axillary or inguinal lymph nodes. Resp: Lungs clear bilaterally. Cardio: Regular rate and rhythm. GI: Abdomen soft and nontender. No hepatomegaly. No mass. Vascular: No leg edema.   Lab Results:  Lab Results  Component Value Date   WBC 7.3 08/25/2015   HGB 14.8 08/25/2015   HCT 44.6 08/25/2015   MCV 86.0 08/25/2015   PLT 364 08/25/2015   NEUTROABS 3.9 08/25/2015    Imaging:  No results found.  Medications: I have reviewed the patient's current medications.  Assessment/Plan: 1. Stage III (T3 N1) adenocarcinoma of the ascending colon status post partial colectomy 08/25/2012. The tumor is microsatellite stable and there was no loss of mismatch repair protein expression.  He began cycle 1 of adjuvant CAPOX on 09/29/2012.   He completed cycle 2 beginning 10/20/2012.   Cycle 3 beginning on 11/10/2012   Cycle 4 beginning 12/01/2012.   Cycle 5 12/22/2012   Cycle 6-Xeloda only, 01/12/2013   Cycle 7 02/02/2013   Cycle 8 02/23/2013  CTs of the chest, abdomen, and pelvis 08/20/2013-negative  Negative surveillance colonoscopy 09/21/2013  CT 08/22/2014, new area of vague hyper enhancement in the posterior hepatic dome  CTs 08/25/2015-no evidence of recurrent colon cancer, indeterminate hyperenhancing "masses" noted on arterial phase  imaging in the right hepatic lobe--presented at the tumor board-liver lesions have been present with no change and were more prominent on the 08/25/2015 scan due to arterial phase imaging. 2. Transverse colon polyp removed in the colectomy specimen 08/25/2012. 3. Iron deficiency anemia. The hemoglobin and MCV corrected into normal range. 4. Persistent Mild elevation of the liver enzymes-potentially related to atorvastatin, evaluation per Dr. Maceo Pro. Liver enzymes normal 02/24/2015. 5. 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 We will follow-up on the CEA from today. He will return for a follow-up visit and CEA in 6 months.    Ned Card ANP/GNP-BC   02/27/2016  10:27 AM

## 2016-02-28 LAB — CEA: CEA: 3.2 ng/mL (ref 0.0–4.7)

## 2016-03-01 ENCOUNTER — Telehealth: Payer: Self-pay

## 2016-03-01 ENCOUNTER — Telehealth: Payer: Self-pay | Admitting: *Deleted

## 2016-03-01 NOTE — Telephone Encounter (Signed)
-----   Message from Lisa K Thomas, NP sent at 02/27/2016  4:33 PM EST ----- Please let him know CEA is normal. 

## 2016-03-01 NOTE — Telephone Encounter (Signed)
Call back received from patient requesting his lab results.  CEA results given to patient.  Patient appreciative of information and has no questions at this time.

## 2016-04-28 ENCOUNTER — Other Ambulatory Visit: Payer: Self-pay | Admitting: Nurse Practitioner

## 2016-08-20 ENCOUNTER — Telehealth: Payer: Self-pay | Admitting: Oncology

## 2016-08-20 ENCOUNTER — Other Ambulatory Visit (HOSPITAL_BASED_OUTPATIENT_CLINIC_OR_DEPARTMENT_OTHER): Payer: 59

## 2016-08-20 ENCOUNTER — Ambulatory Visit (HOSPITAL_BASED_OUTPATIENT_CLINIC_OR_DEPARTMENT_OTHER): Payer: 59 | Admitting: Oncology

## 2016-08-20 VITALS — BP 150/88 | HR 75 | Temp 97.8°F | Resp 18 | Ht 67.0 in | Wt 190.0 lb

## 2016-08-20 DIAGNOSIS — D509 Iron deficiency anemia, unspecified: Secondary | ICD-10-CM | POA: Diagnosis not present

## 2016-08-20 DIAGNOSIS — C182 Malignant neoplasm of ascending colon: Secondary | ICD-10-CM | POA: Diagnosis not present

## 2016-08-20 DIAGNOSIS — C184 Malignant neoplasm of transverse colon: Secondary | ICD-10-CM

## 2016-08-20 LAB — CEA (IN HOUSE-CHCC): CEA (CHCC-IN HOUSE): 3.2 ng/mL (ref 0.00–5.00)

## 2016-08-20 NOTE — Telephone Encounter (Signed)
Appointments scheduled per 08/20/16 los. Patient was given a copy of the AVS report and appointment schedule per 08/20/16 los. °

## 2016-08-20 NOTE — Progress Notes (Signed)
  [facility] OFFICE PROGRESS NOTE   Diagnosis: Colon cancer  INTERVAL HISTORY:   Mr. Jason Bray returns as scheduled. He feels well. No difficulty with bowel function. He continues to have mild neuropathy symptoms in the feet. None in the hands.  Objective:  Vital signs in last 24 hours:  Blood pressure (!) 150/88, pulse 75, temperature 97.8 F (36.6 C), temperature source Oral, resp. rate 18, height '5\' 7"'$  (1.702 m), weight 190 lb (86.2 kg), SpO2 99 %.    HEENT: Neck without mass Lymphatics: No cervical, supraclavicular, axillary, or inguinal nodes Resp: Lungs clear bilaterally Cardio: Regular rate and rhythm GI: No hepatosplenomegaly, no mass, nontender Vascular: No leg edema   Lab Results:  Lab Results  Component Value Date   WBC 7.3 08/25/2015   HGB 14.8 08/25/2015   HCT 44.6 08/25/2015   MCV 86.0 08/25/2015   PLT 364 08/25/2015   NEUTROABS 3.9 08/25/2015    CMP     Component Value Date/Time   NA 139 08/25/2015 0750   K 4.1 08/25/2015 0750   CL 102 09/18/2012 1400   CO2 24 08/25/2015 0750   GLUCOSE 104 08/25/2015 0750   BUN 18.1 08/25/2015 0750   CREATININE 0.9 08/25/2015 0750   CALCIUM 9.5 08/25/2015 0750   PROT 8.1 08/25/2015 0750   ALBUMIN 4.2 08/25/2015 0750   AST 36 (H) 08/25/2015 0750   ALT 57 (H) 08/25/2015 0750   ALKPHOS 80 08/25/2015 0750   BILITOT 0.91 08/25/2015 0750   GFRNONAA >90 09/18/2012 1400   GFRAA >90 09/18/2012 1400    Lab Results  Component Value Date   CEA1 2.90 02/27/2016   CEA1 3.2 02/27/2016     Medications: I have reviewed the patient's current medications.  Assessment/Plan: 1. Stage III (T3 N1) adenocarcinoma of the ascending colon status post partial colectomy 08/25/2012. The tumor is microsatellite stable and there was no loss of mismatch repair protein expression.  He began cycle 1 of adjuvant CAPOX on 09/29/2012.   He completed cycle 2 beginning 10/20/2012.   Cycle 3 beginning on 11/10/2012    Cycle 4 beginning 12/01/2012.   Cycle 5 12/22/2012   Cycle 6-Xeloda only, 01/12/2013   Cycle 7 02/02/2013   Cycle 8 02/23/2013  CTs of the chest, abdomen, and pelvis 08/20/2013-negative  Negative surveillance colonoscopy 09/21/2013  CT 08/22/2014, new area of vague hyper enhancement in the posterior hepatic dome  CTs 08/25/2015-no evidence of recurrent colon cancer, indeterminate hyperenhancing "masses" noted on arterial phase imaging in the right hepatic lobe--presented at the tumor board-liver lesions have been present with no change and were more prominent on the 08/25/2015 scan due to arterial phase imaging. 2. Transverse colon polyp removed in the colectomy specimen 08/25/2012. 3. Iron deficiency anemia. The hemoglobin and MCV corrected into normal range. 4. History of Mild elevation of the liver enzymes-potentially related to atorvastatin, evaluation per Dr. Maceo Pro. Liver enzymes normal 02/24/2015. 5. 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 We will follow-up on the CEA from today. He will return for an office visit and CEA in 6 months. He is due for a surveillance colonoscopy. I will refer him to Dr. Michail Sermon.  15 minutes were spent with the patient today. The majority of the time was used for counseling and coordination of care.  [name], MD  08/20/2016  10:00 AM

## 2016-08-21 LAB — CEA: CEA: 4.1 ng/mL (ref 0.0–4.7)

## 2016-08-23 ENCOUNTER — Telehealth: Payer: Self-pay | Admitting: *Deleted

## 2016-08-23 NOTE — Telephone Encounter (Signed)
Per Dr. Sherrill, patient notified that cea is normal.  Patient appreciative of call and has no questions at this time. 

## 2016-08-23 NOTE — Telephone Encounter (Signed)
-----   Message from Ladell Pier, MD sent at 08/23/2016  5:35 PM EDT ----- Please call patient, cea is normal

## 2016-10-14 ENCOUNTER — Telehealth: Payer: Self-pay | Admitting: *Deleted

## 2016-10-14 ENCOUNTER — Telehealth: Payer: Self-pay

## 2016-10-14 ENCOUNTER — Encounter: Payer: Self-pay | Admitting: *Deleted

## 2016-10-14 NOTE — Telephone Encounter (Signed)
Patient's wife calling. They have not received  An appt for colonoscopy. Dr Benay Spice was to refer them to dr schooler. Contact # [phone]

## 2016-10-14 NOTE — Telephone Encounter (Signed)
Call placed to inform pt that referral had been re-submitted today to MD Schooler's office. Pt appreciative of call back.

## 2016-10-18 ENCOUNTER — Other Ambulatory Visit: Payer: Self-pay

## 2016-10-19 ENCOUNTER — Other Ambulatory Visit: Payer: Self-pay

## 2016-10-19 DIAGNOSIS — C184 Malignant neoplasm of transverse colon: Secondary | ICD-10-CM

## 2016-10-19 DIAGNOSIS — D126 Benign neoplasm of colon, unspecified: Secondary | ICD-10-CM

## 2017-02-25 ENCOUNTER — Telehealth: Payer: Self-pay | Admitting: Oncology

## 2017-02-25 ENCOUNTER — Encounter: Payer: Self-pay | Admitting: Nurse Practitioner

## 2017-02-25 ENCOUNTER — Ambulatory Visit (HOSPITAL_BASED_OUTPATIENT_CLINIC_OR_DEPARTMENT_OTHER): Payer: 59 | Admitting: Nurse Practitioner

## 2017-02-25 ENCOUNTER — Other Ambulatory Visit (HOSPITAL_BASED_OUTPATIENT_CLINIC_OR_DEPARTMENT_OTHER): Payer: 59

## 2017-02-25 VITALS — BP 150/75 | HR 79 | Temp 98.0°F | Resp 18 | Ht 67.0 in | Wt 194.0 lb

## 2017-02-25 DIAGNOSIS — C184 Malignant neoplasm of transverse colon: Secondary | ICD-10-CM

## 2017-02-25 DIAGNOSIS — Z86010 Personal history of colonic polyps: Secondary | ICD-10-CM

## 2017-02-25 DIAGNOSIS — C182 Malignant neoplasm of ascending colon: Secondary | ICD-10-CM

## 2017-02-25 DIAGNOSIS — G620 Drug-induced polyneuropathy: Secondary | ICD-10-CM | POA: Diagnosis not present

## 2017-02-25 DIAGNOSIS — D509 Iron deficiency anemia, unspecified: Secondary | ICD-10-CM

## 2017-02-25 LAB — CEA (IN HOUSE-CHCC): CEA (CHCC-IN HOUSE): 3.38 ng/mL (ref 0.00–5.00)

## 2017-02-25 NOTE — Telephone Encounter (Signed)
Gave avs and calendar for May 2019 °

## 2017-02-25 NOTE — Progress Notes (Addendum)
  [facility] OFFICE PROGRESS NOTE   Diagnosis: Colon cancer  INTERVAL HISTORY:   Jason Bray returns as scheduled.  He feels well.  No change in bowel habits.  No bleeding.  No abdominal pain.  He has a good appetite.  He reports undergoing a colonoscopy in September of this year.  Objective:  Vital signs in last 24 hours:  Blood pressure (!) 150/75, pulse 79, temperature 98 F (36.7 C), temperature source Oral, resp. rate 18, height '5\' 7"'$  (1.702 m), weight 194 lb (88 kg), SpO2 98 %.    HEENT: Neck without mass. Lymphatics: No palpable cervical, supraclavicular, axillary or inguinal lymph nodes. Resp: Lungs clear bilaterally. Cardio: Regular rate and rhythm. GI: Abdomen soft and nontender.  No hepatosplenomegaly.  No mass. Vascular: No leg edema.  Lab Results:  Lab Results  Component Value Date   WBC 7.3 08/25/2015   HGB 14.8 08/25/2015   HCT 44.6 08/25/2015   MCV 86.0 08/25/2015   PLT 364 08/25/2015   NEUTROABS 3.9 08/25/2015    Imaging:  No results found.  Medications: I have reviewed the patient's current medications.  Assessment/Plan: 1. Stage III (T3 N1) adenocarcinoma of the ascending colon status post partial colectomy 08/25/2012. The tumor is microsatellite stable and there was no loss of mismatch repair protein expression.  He began cycle 1 of adjuvant CAPOX on 09/29/2012.   He completed cycle 2 beginning 10/20/2012.   Cycle 3 beginning on 11/10/2012   Cycle 4 beginning 12/01/2012.   Cycle 5 12/22/2012   Cycle 6-Xeloda only, 01/12/2013   Cycle 7 02/02/2013   Cycle 8 02/23/2013  CTs of the chest, abdomen, and pelvis 08/20/2013-negative  Negative surveillance colonoscopy 09/21/2013  CT 08/22/2014, new area of vague hyper enhancement in the posterior hepatic dome  CTs 08/25/2015-no evidence of recurrent colon cancer, indeterminate hyperenhancing "masses" noted on arterial phase imaging in the right hepatic  lobe--presented at the tumor board-liver lesions have been present with no change and were more prominent on the 08/25/2015 scan due to arterial phase imaging.  Surveillance colonoscopy 12/31/2016-three 2-6 mm polyps in the rectum at the rectosigmoid colon (tubular adenomas, hyperplastic polyp) 2. Transverse colon polyp removed in the colectomy specimen 08/25/2012. 3. Iron deficiency anemia. The hemoglobin and MCV corrected into normal range. 4. History of Mild elevation of the liver enzymes-potentially related to atorvastatin, evaluation per Dr. Maceo Pro. Liver enzymes normal 02/24/2015. 5. 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  We will follow-up on the CEA from today.  He will return for a CEA and follow-up visit in 6 months.    Jason Bray ANP/GNP-BC   02/25/2017  9:03 AM

## 2017-08-25 ENCOUNTER — Inpatient Hospital Stay: Payer: 59

## 2017-08-25 ENCOUNTER — Telehealth: Payer: Self-pay | Admitting: Oncology

## 2017-08-25 ENCOUNTER — Inpatient Hospital Stay: Payer: 59 | Attending: Oncology | Admitting: Oncology

## 2017-08-25 VITALS — BP 133/95 | HR 75 | Temp 98.0°F | Resp 18 | Ht 67.0 in | Wt 189.0 lb

## 2017-08-25 DIAGNOSIS — R202 Paresthesia of skin: Secondary | ICD-10-CM | POA: Diagnosis not present

## 2017-08-25 DIAGNOSIS — C182 Malignant neoplasm of ascending colon: Secondary | ICD-10-CM

## 2017-08-25 DIAGNOSIS — C184 Malignant neoplasm of transverse colon: Secondary | ICD-10-CM

## 2017-08-25 LAB — CEA (IN HOUSE-CHCC): CEA (CHCC-In House): 2.77 ng/mL (ref 0.00–5.00)

## 2017-08-25 NOTE — Progress Notes (Signed)
  [facility] OFFICE PROGRESS NOTE   Diagnosis: Colon cancer  INTERVAL HISTORY:   Mr. Yackley returns as scheduled.  He feels well.  He continues to have mild increased sensitivity at the soles of the feet when he walks on certain surfaces.  Good appetite and energy level. He underwent a colonoscopy on 12/31/2016.  Polyps were removed from the rectum and rectosigmoid colon.  The pathology revealed tubular adenomas and a hyperplastic polyp.  He reports that he will be scheduled for a repeat colonoscopy in 3 years.  Objective:  Vital signs in last 24 hours:  Blood pressure (!) 133/95, pulse 75, temperature 98 F (36.7 C), temperature source Oral, resp. rate 18, height '5\' 7"'$  (1.702 m), weight 189 lb (85.7 kg), SpO2 98 %.    HEENT: Neck without mass Lymphatics: No cervical, supraclavicular, axillary, or inguinal nodes Resp: Lungs clear bilaterally Cardio: Regular rate and rhythm GI: No hepatomegaly, no mass, nontender Vascular: Leg edema    Lab Results  Component Value Date   CEA1 2.77 08/25/2017     Medications: I have reviewed the patient's current medications.   Assessment/Plan: 1. Stage III (T3 N1) adenocarcinoma of the ascending colon status post partial colectomy 08/25/2012. The tumor is microsatellite stable and there was no loss of mismatch repair protein expression.  He began cycle 1 of adjuvant CAPOX on 09/29/2012.   He completed cycle 2 beginning 10/20/2012.   Cycle 3 beginning on 11/10/2012   Cycle 4 beginning 12/01/2012.   Cycle 5 12/22/2012   Cycle 6-Xeloda only, 01/12/2013   Cycle 7 02/02/2013   Cycle 8 02/23/2013  CTs of the chest, abdomen, and pelvis 08/20/2013-negative  Negative surveillance colonoscopy 09/21/2013  CT 08/22/2014, new area of vague hyper enhancement in the posterior hepatic dome  CTs 08/25/2015-no evidence of recurrent colon cancer, indeterminate hyperenhancing "masses" noted on arterial phase imaging in the  right hepatic lobe--presented at the tumor board-liver lesions have been present with no change and were more prominent on the 08/25/2015 scan due to arterial phase imaging.  Surveillance colonoscopy 12/31/2016-three 2-6 mm polyps in the rectum at the rectosigmoid colon (tubular adenomas, hyperplastic polyp) 2. Transverse colon polyp removed in the colectomy specimen 08/25/2012. 3. Iron deficiency anemia. The hemoglobin and MCV corrected into normal range. 4. History ofMild elevation of the liver enzymes-potentially related to atorvastatin, evaluation per Dr. Maceo Pro. Liver enzymes normal 02/24/2015. 5. 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 Nestle remains in clinical remission from colon cancer.  He is now 5 years out from diagnosis.  He has a good prognosis for a long-term disease-free survival.  He would like to continue follow-up at the Cancer center.  He will return for an office visit and CEA in 1 year.  He will continue surveillance colonoscopies with Dr. Michail Sermon.  15 minutes were spent with the patient today.  The majority of the time was used for counseling and coordination of care.  [name], MD  08/25/2017  5:05 PM

## 2017-08-25 NOTE — Telephone Encounter (Signed)
Appointments scheduled AVS/Calendar printed per 5/16 los °

## 2018-08-29 ENCOUNTER — Ambulatory Visit: Payer: 59 | Admitting: Nurse Practitioner

## 2018-08-29 ENCOUNTER — Other Ambulatory Visit: Payer: 59

## 2018-10-31 ENCOUNTER — Telehealth: Payer: Self-pay | Admitting: Nurse Practitioner

## 2018-10-31 ENCOUNTER — Other Ambulatory Visit: Payer: Self-pay

## 2018-10-31 ENCOUNTER — Encounter: Payer: Self-pay | Admitting: Nurse Practitioner

## 2018-10-31 ENCOUNTER — Inpatient Hospital Stay (HOSPITAL_BASED_OUTPATIENT_CLINIC_OR_DEPARTMENT_OTHER): Payer: 59 | Admitting: Nurse Practitioner

## 2018-10-31 ENCOUNTER — Inpatient Hospital Stay: Payer: 59 | Attending: Nurse Practitioner

## 2018-10-31 VITALS — BP 148/85 | HR 77 | Temp 98.5°F | Resp 18 | Ht 67.0 in | Wt 179.8 lb

## 2018-10-31 DIAGNOSIS — C182 Malignant neoplasm of ascending colon: Secondary | ICD-10-CM

## 2018-10-31 DIAGNOSIS — C184 Malignant neoplasm of transverse colon: Secondary | ICD-10-CM

## 2018-10-31 LAB — CEA (IN HOUSE-CHCC): CEA (CHCC-In House): 3.77 ng/mL (ref 0.00–5.00)

## 2018-10-31 NOTE — Telephone Encounter (Signed)
Gave avs and calendar ° °

## 2018-10-31 NOTE — Progress Notes (Signed)
  [facility] OFFICE PROGRESS NOTE   Diagnosis: Colon cancer  INTERVAL HISTORY:   Jason Bray returns as scheduled.  He feels well.  No change in bowel habits.  No bleeding or pain with bowel movements.  He denies abdominal pain.  He reports a good appetite.  We discussed his weight loss over the past year.  He reports the weight loss has been intentional and due to a change in his diet.  Objective:  Vital signs in last 24 hours:  Blood pressure (!) 148/85, pulse 77, temperature 98.5 F (36.9 C), temperature source Oral, resp. rate 18, height '5\' 7"'$  (1.702 m), weight 179 lb 12.8 oz (81.6 kg), SpO2 98 %.    HEENT: Neck without mass. Lymphatics: No palpable cervical, supraclavicular, axillary or inguinal lymph nodes. Resp: Lungs clear bilaterally. Cardio: Regular rate and rhythm. GI: Abdomen soft and nontender.  No hepatomegaly. Vascular: No leg edema.   Lab Results:  Lab Results  Component Value Date   WBC 7.3 08/25/2015   HGB 14.8 08/25/2015   HCT 44.6 08/25/2015   MCV 86.0 08/25/2015   PLT 364 08/25/2015   NEUTROABS 3.9 08/25/2015    Imaging:  No results found.  Medications: I have reviewed the patient's current medications.  Assessment/Plan: 1. Stage III (T3 N1) adenocarcinoma of the ascending colon status post partial colectomy 08/25/2012. The tumor is microsatellite stable and there was no loss of mismatch repair protein expression.  He began cycle 1 of adjuvant CAPOX on 09/29/2012.   He completed cycle 2 beginning 10/20/2012.   Cycle 3 beginning on 11/10/2012   Cycle 4 beginning 12/01/2012.   Cycle 5 12/22/2012   Cycle 6-Xeloda only, 01/12/2013   Cycle 7 02/02/2013   Cycle 8 02/23/2013  CTs of the chest, abdomen, and pelvis 08/20/2013-negative  Negative surveillance colonoscopy 09/21/2013  CT 08/22/2014, new area of vague hyper enhancement in the posterior hepatic dome  CTs 08/25/2015-no evidence of recurrent colon cancer,  indeterminate hyperenhancing "masses" noted on arterial phase imaging in the right hepatic lobe--presented at the tumor board-liver lesions have been present with no change and were more prominent on the 08/25/2015 scan due to arterial phase imaging.  Surveillance colonoscopy 12/31/2016-three2-6 mm polyps in the rectum at the rectosigmoid colon (tubular adenomas, hyperplastic polyp) 2. Transverse colon polyp removed in the colectomy specimen 08/25/2012. 3. Iron deficiency anemia. The hemoglobin and MCV corrected into normal range. 4. History ofMild elevation of the liver enzymes-potentially related to atorvastatin, evaluation per Dr. Maceo Pro. Liver enzymes normal 02/24/2015. 5. 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  He remains in clinical remission from colon cancer.  We will follow-up on the CEA from today.  He will return for a CEA and follow-up visit in 1 year.  He will contact the office in the interim with any problems.    Ned Card ANP/GNP-BC   10/31/2018  9:45 AM

## 2019-10-30 ENCOUNTER — Telehealth: Payer: Self-pay | Admitting: Oncology

## 2019-10-30 ENCOUNTER — Other Ambulatory Visit: Payer: Self-pay

## 2019-10-30 ENCOUNTER — Inpatient Hospital Stay: Payer: 59

## 2019-10-30 ENCOUNTER — Inpatient Hospital Stay: Payer: 59 | Attending: Oncology | Admitting: Oncology

## 2019-10-30 VITALS — BP 143/79 | HR 82 | Temp 97.9°F | Resp 17 | Ht 67.0 in | Wt 184.8 lb

## 2019-10-30 DIAGNOSIS — C182 Malignant neoplasm of ascending colon: Secondary | ICD-10-CM | POA: Insufficient documentation

## 2019-10-30 DIAGNOSIS — C184 Malignant neoplasm of transverse colon: Secondary | ICD-10-CM

## 2019-10-30 DIAGNOSIS — Z9049 Acquired absence of other specified parts of digestive tract: Secondary | ICD-10-CM | POA: Diagnosis not present

## 2019-10-30 LAB — CEA (IN HOUSE-CHCC): CEA (CHCC-In House): 3.58 ng/mL (ref 0.00–5.00)

## 2019-10-30 NOTE — Telephone Encounter (Signed)
Scheduled per 7/20 los. Printed avs and calendar for pt.

## 2019-10-30 NOTE — Progress Notes (Signed)
[facility] OFFICE PROGRESS NOTE   Diagnosis: Colon cancer  INTERVAL HISTORY:   Jason Bray returns as scheduled.  He feels well.  Good appetite.  No difficulty with bowel function.  He is working.  He reports intermittent discomfort in the left posterior leg.  He reports being diagnosed with "sciatica "when he was evaluated by his primary provider.  He plans to have a colonoscopy with Dr. Michail Sermon within the next few months.  Objective:  Vital signs in last 24 hours:  Blood pressure (!) 143/79, pulse 82, temperature 97.9 F (36.6 C), temperature source Temporal, resp. rate 17, height '5\' 7"'  (1.702 m), weight 184 lb 12.8 oz (83.8 kg), SpO2 100 %.    Lymphatics: No cervical, supraclavicular, axillary, or inguinal nodes Resp: Bronchial sounds at the right greater than left upper posterior chest, no respiratory distress Cardio: Regular rate and rhythm GI: No mass, nontender, no hepatosplenomegaly Vascular: No leg edema     Lab Results:  Lab Results  Component Value Date   WBC 7.3 08/25/2015   HGB 14.8 08/25/2015   HCT 44.6 08/25/2015   MCV 86.0 08/25/2015   PLT 364 08/25/2015   NEUTROABS 3.9 08/25/2015    CMP  Lab Results  Component Value Date   NA 139 08/25/2015   K 4.1 08/25/2015   CL 102 09/18/2012   CO2 24 08/25/2015   GLUCOSE 104 08/25/2015   BUN 18.1 08/25/2015   CREATININE 0.9 08/25/2015   CALCIUM 9.5 08/25/2015   PROT 8.1 08/25/2015   ALBUMIN 4.2 08/25/2015   AST 36 (H) 08/25/2015   ALT 57 (H) 08/25/2015   ALKPHOS 80 08/25/2015   BILITOT 0.91 08/25/2015   GFRNONAA >90 09/18/2012   GFRAA >90 09/18/2012    Lab Results  Component Value Date   CEA1 3.77 10/31/2018     Medications: I have reviewed the patient's current medications.   Assessment/Plan: 1. Stage III (T3 N1) adenocarcinoma of the ascending colon status post partial colectomy 08/25/2012. The tumor is microsatellite stable and there was no loss of mismatch repair protein  expression.  He began cycle 1 of adjuvant CAPOX on 09/29/2012.   He completed cycle 2 beginning 10/20/2012.   Cycle 3 beginning on 11/10/2012   Cycle 4 beginning 12/01/2012.   Cycle 5 12/22/2012   Cycle 6-Xeloda only, 01/12/2013   Cycle 7 02/02/2013   Cycle 8 02/23/2013  CTs of the chest, abdomen, and pelvis 08/20/2013-negative  Negative surveillance colonoscopy 09/21/2013  CT 08/22/2014, new area of vague hyper enhancement in the posterior hepatic dome  CTs 08/25/2015-no evidence of recurrent colon cancer, indeterminate hyperenhancing "masses" noted on arterial phase imaging in the right hepatic lobe--presented at the tumor board-liver lesions have been present with no change and were more prominent on the 08/25/2015 scan due to arterial phase imaging.  Surveillance colonoscopy 12/31/2016-three2-6 mm polyps in the rectum at the rectosigmoid colon (tubular adenomas, hyperplastic polyp) 2. Transverse colon polyp removed in the colectomy specimen 08/25/2012. 3. Iron deficiency anemia. The hemoglobin and MCV corrected into normal range. 4. History ofMild elevation of the liver enzymes-potentially related to atorvastatin, evaluation per Dr. Maceo Pro. Liver enzymes normal 02/24/2015. 5. 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  We will follow up on the CEA from today.  He is now greater than 7 years out from diagnosis.  He has a good prognosis for a long-term disease-free survival.  He would like to continue  follow-up at the Cancer center.  He will return for an office visit in 1 year.  Jason Bray will continue colonoscopy surveillance with Dr. Michail Sermon.  [name], MD  10/30/2019  9:33 AM

## 2020-10-20 ENCOUNTER — Emergency Department (HOSPITAL_BASED_OUTPATIENT_CLINIC_OR_DEPARTMENT_OTHER): Payer: 59 | Admitting: Radiology

## 2020-10-20 ENCOUNTER — Emergency Department (HOSPITAL_BASED_OUTPATIENT_CLINIC_OR_DEPARTMENT_OTHER)
Admission: EM | Admit: 2020-10-20 | Discharge: 2020-10-20 | Disposition: A | Discharge status: Home / Self Care | Payer: 59 | Attending: Emergency Medicine | Admitting: Emergency Medicine

## 2020-10-20 ENCOUNTER — Encounter (HOSPITAL_BASED_OUTPATIENT_CLINIC_OR_DEPARTMENT_OTHER): Payer: Self-pay | Admitting: Obstetrics and Gynecology

## 2020-10-20 ENCOUNTER — Other Ambulatory Visit: Payer: Self-pay

## 2020-10-20 ENCOUNTER — Emergency Department (HOSPITAL_BASED_OUTPATIENT_CLINIC_OR_DEPARTMENT_OTHER): Payer: 59

## 2020-10-20 DIAGNOSIS — R1012 Left upper quadrant pain: Secondary | ICD-10-CM | POA: Diagnosis present

## 2020-10-20 DIAGNOSIS — Z7982 Long term (current) use of aspirin: Secondary | ICD-10-CM | POA: Insufficient documentation

## 2020-10-20 DIAGNOSIS — I10 Essential (primary) hypertension: Secondary | ICD-10-CM | POA: Diagnosis not present

## 2020-10-20 DIAGNOSIS — Z85038 Personal history of other malignant neoplasm of large intestine: Secondary | ICD-10-CM | POA: Diagnosis not present

## 2020-10-20 DIAGNOSIS — K859 Acute pancreatitis without necrosis or infection, unspecified: Secondary | ICD-10-CM

## 2020-10-20 DIAGNOSIS — F17210 Nicotine dependence, cigarettes, uncomplicated: Secondary | ICD-10-CM | POA: Diagnosis not present

## 2020-10-20 DIAGNOSIS — Z79899 Other long term (current) drug therapy: Secondary | ICD-10-CM | POA: Insufficient documentation

## 2020-10-20 LAB — URINALYSIS, ROUTINE W REFLEX MICROSCOPIC
Bilirubin Urine: NEGATIVE
Glucose, UA: NEGATIVE mg/dL
Ketones, ur: NEGATIVE mg/dL
Leukocytes,Ua: NEGATIVE
Nitrite: NEGATIVE
Protein, ur: NEGATIVE mg/dL
Specific Gravity, Urine: 1.012 (ref 1.005–1.030)
pH: 5 (ref 5.0–8.0)

## 2020-10-20 LAB — COMPREHENSIVE METABOLIC PANEL
ALT: 38 U/L (ref 0–44)
AST: 27 U/L (ref 15–41)
Albumin: 4.7 g/dL (ref 3.5–5.0)
Alkaline Phosphatase: 63 U/L (ref 38–126)
Anion gap: 10 (ref 5–15)
BUN: 16 mg/dL (ref 8–23)
CO2: 25 mmol/L (ref 22–32)
Calcium: 9.3 mg/dL (ref 8.9–10.3)
Chloride: 104 mmol/L (ref 98–111)
Creatinine, Ser: 0.83 mg/dL (ref 0.61–1.24)
GFR, Estimated: >60 mL/min (ref >60)
Glucose, Bld: 101 mg/dL — ABNORMAL HIGH (ref 70–99)
Potassium: 4 mmol/L (ref 3.5–5.1)
Sodium: 139 mmol/L (ref 135–145)
Total Bilirubin: 0.8 mg/dL (ref 0.3–1.2)
Total Protein: 7.7 g/dL (ref 6.5–8.1)

## 2020-10-20 LAB — CBC
HCT: 43.1 % (ref 39.0–52.0)
Hemoglobin: 14.4 g/dL (ref 13.0–17.0)
MCH: 29.4 pg (ref 26.0–34.0)
MCHC: 33.4 g/dL (ref 30.0–36.0)
MCV: 88 fL (ref 80.0–100.0)
Platelets: 377 10*3/uL (ref 150–400)
RBC: 4.9 MIL/uL (ref 4.22–5.81)
RDW: 12.8 % (ref 11.5–15.5)
WBC: 8.4 10*3/uL (ref 4.0–10.5)
nRBC: 0 % (ref 0.0–0.2)

## 2020-10-20 LAB — LIPASE, BLOOD: Lipase: 68 U/L — ABNORMAL HIGH (ref 11–51)

## 2020-10-20 LAB — TROPONIN I (HIGH SENSITIVITY)
Troponin I (High Sensitivity): 3 ng/L (ref <18)
Troponin I (High Sensitivity): 4 ng/L (ref <18)

## 2020-10-20 NOTE — Discharge Instructions (Addendum)
Follow-up with your primary care doctor.  Return here as needed for any worsening symptoms. 

## 2020-10-20 NOTE — ED Triage Notes (Signed)
Patient presents to the ER for abdominal pain in the LLQ, no hx of diverticulitis. Patient reports he has had some of his colon has been removed. Patient reports he has had left shoulder pain as well and then felt jaw pain.

## 2020-10-20 NOTE — ED Provider Notes (Signed)
[facility] Provider Note   CSN: [id number] Arrival date & time: 10/20/20  1719     History Chief Complaint  Patient presents with   Abdominal Pain    Jason Bray is a 65 y.o. male.  Patient is a 65 year old male who presents with abdominal pain.  He has a history of GERD, hypertension, hyperlipidemia and anxiety.  He also has a history of prior colon cancer.  He states over the last 2 days he has had some intermittent pain in his left mid abdomen.  Its nonradiating.  He says it sharp and stabbing in nature and comes and goes.  He denies any urinary symptoms.  No nausea or vomiting.  No fevers.  No change in bowels.  He says that 1 time the pain radiated up into his left shoulder.  He also says last week he had some soreness in his left chest which felt like he had worked out or used his arm too much.  That lasted 2 days and he has not had any further episodes of that.  He also says it feels like his jaws a little bit sore.  That is been going on for about 2 days.  He says it feels like his jaws tense.  He said given the constellations of symptoms, he was concerned and got anxious about and he came in to get checked out.  No known history of prior heart problems.      Past Medical History:  Diagnosis Date   Anemia    Anxiety    colon ca dx'd 07/2012   cancerous colon polyp   GERD (gastroesophageal reflux disease)    Hyperlipidemia     Patient Active Problem List   Diagnosis Date Noted   Peripheral neuropathy, secondary to chemotherapy drugs  04/09/2013   Cancer of transverse colon s/p partial colectomy pT3,pN1a (1/28),pMX 08/25/2012 08/09/2012   Adenomatous colon polyp - mid transverse colon s/p removal 08/09/2012   HYPERLIPIDEMIA 07/09/2009   ANXIETY 07/09/2009   CHEST PAIN 07/09/2009    Past Surgical History:  Procedure Laterality Date   CARDIAC CATHETERIZATION  06/2009   non cardiac   COLONOSCOPY     LAPAROSCOPIC PARTIAL COLECTOMY N/A  08/25/2012   Procedure: LAPAROSCOPIC PARTIAL COLECTOMY  ;  Surgeon: Adin Hector, MD;  Location: [facility];  Service: General;  Laterality: N/A;   PORTACATH PLACEMENT N/A 09/22/2012   Procedure: INSERTION PORT-A-CATH UNDER ULTRASOUND & FLUROSCOPIC GUIDANCE;  Surgeon: Adin Hector, MD;  Location: [facility];  Service: General;  Laterality: N/A;       Family History  Problem Relation Age of Onset   Lymphoma Mother    Hypertension Father    Hyperlipidemia Father    Hypertension Brother     Social History   Tobacco Use   Smoking status: Every Day    Packs/day: 1.00    Years: 20.00    Pack years: 20.00    Types: Cigarettes    Last attempt to quit: 01/19/2007    Years since quitting: 13.7    Passive exposure: Never   Smokeless tobacco: Former    Types: Chew    Quit date: 01/18/1998  Vaping Use   Vaping Use: Never used  Substance Use Topics   Alcohol use: Yes    Comment: weekly    Drug use: No    Home Medications Prior to Admission medications   Medication Sig Start Date End Date Taking? Authorizing Provider  aspirin 81 MG tablet Take  81 mg by mouth daily. Taking sporadically    [provider]  atorvastatin (LIPITOR) 20 MG tablet Take 20 mg by mouth daily. 01/27/14   [provider]  metoprolol succinate (TOPROL-XL) 50 MG 24 hr tablet Take 50 mg by mouth at bedtime. Take with or immediately following a meal.    [provider]  Omega-3 Fatty Acids (FISH OIL PO) Take by mouth.    [provider]    Allergies    Patient has no known allergies.  Review of Systems   Review of Systems  Constitutional:  Negative for chills, diaphoresis, fatigue and fever.  HENT:  Negative for congestion, rhinorrhea and sneezing.        Jaw discomfort  Eyes: Negative.   Respiratory:  Negative for cough, chest tightness and shortness of breath.   Cardiovascular:  Positive for chest pain (Last week, no recent chest pain). Negative for leg swelling.   Gastrointestinal:  Positive for abdominal pain. Negative for blood in stool, diarrhea, nausea and vomiting.  Genitourinary:  Negative for difficulty urinating, flank pain, frequency and hematuria.  Musculoskeletal:  Negative for arthralgias and back pain.  Skin:  Negative for rash.  Neurological:  Negative for dizziness, speech difficulty, weakness, numbness and headaches.   Physical Exam Updated Vital Signs BP 135/81 (BP Location: Right Arm)   Pulse 70   Temp 98.4 F (36.9 C) (Oral)   Resp 18   SpO2 98%   Physical Exam Constitutional:      Appearance: He is well-developed.  HENT:     Head: Normocephalic and atraumatic.  Eyes:     Pupils: Pupils are equal, round, and reactive to light.  Cardiovascular:     Rate and Rhythm: Normal rate and regular rhythm.     Heart sounds: Normal heart sounds.  Pulmonary:     Effort: Pulmonary effort is normal. No respiratory distress.     Breath sounds: Normal breath sounds. No wheezing or rales.  Chest:     Chest wall: No tenderness.  Abdominal:     General: Bowel sounds are normal.     Palpations: Abdomen is soft.     Tenderness: There is abdominal tenderness in the left upper quadrant. There is no guarding or rebound.  Musculoskeletal:        General: Normal range of motion.     Cervical back: Normal range of motion and neck supple.  Lymphadenopathy:     Cervical: No cervical adenopathy.  Skin:    General: Skin is warm and dry.     Findings: No rash.  Neurological:     Mental Status: He is alert and oriented to person, place, and time.    ED Results / Procedures / Treatments   Labs (all labs ordered are listed, but only abnormal results are displayed) Labs Reviewed  LIPASE, BLOOD - Abnormal; Notable for the following components:      Result Value   Lipase 68 (*)    All other components within normal limits  COMPREHENSIVE METABOLIC PANEL - Abnormal; Notable for the following components:   Glucose, Bld 101 (*)    All other  components within normal limits  URINALYSIS, ROUTINE W REFLEX MICROSCOPIC - Abnormal; Notable for the following components:   Color, Urine COLORLESS (*)    Hgb urine dipstick SMALL (*)    All other components within normal limits  CBC  TROPONIN I (HIGH SENSITIVITY)  TROPONIN I (HIGH SENSITIVITY)    EKG EKG Interpretation  Date/Time:  Monday  October 20 2020 17:46:46 EDT Ventricular Rate:  78 PR Interval:  162 QRS Duration: 110 QT Interval:  394 QTC Calculation: 449 R Axis:   96 Text Interpretation: Normal sinus rhythm Rightward axis Incomplete right bundle branch block Nonspecific ST abnormality Abnormal ECG since last tracing no significant change Confirmed by Malvin Johns [phone]) on 10/20/2020 10:16:30 PM  Radiology DG Chest 2 View  Result Date: 10/20/2020 CLINICAL DATA:  65 year old male with chest pain. EXAM: CHEST - 2 VIEW COMPARISON:  Chest radiograph dated 09/22/2012. FINDINGS: The lungs are clear. There is no pleural effusion or pneumothorax. The cardiac silhouette is within limits. Atherosclerotic calcification of the aortic arch. Degenerative changes of the spine. No acute osseous pathology. IMPRESSION: No active cardiopulmonary disease. Electronically Signed   By: Anner Crete M.D.   On: 10/20/2020 19:05   CT Renal Stone Study  Result Date: 10/20/2020 CLINICAL DATA:  65 year old male with flank pain. Concern for kidney stone. EXAM: CT ABDOMEN AND PELVIS WITHOUT CONTRAST TECHNIQUE: Multidetector CT imaging of the abdomen and pelvis was performed following the standard protocol without IV contrast. COMPARISON:  CT abdomen pelvis dated 08/25/2015. FINDINGS: Evaluation of this exam is limited in the absence of intravenous contrast. Lower chest: The visualized lung bases are clear. No intra-abdominal free air or free fluid. Hepatobiliary: There is a 15 mm cyst in the left lobe of the liver. The liver is otherwise unremarkable. No intrahepatic biliary ductal dilatation. The  gallbladder is unremarkable Pancreas: Unremarkable. No pancreatic ductal dilatation or surrounding inflammatory changes. Spleen: Normal in size without focal abnormality. Adrenals/Urinary Tract: The adrenal glands unremarkable. The kidneys, visualized ureters, and the urinary bladder are unremarkable. Stomach/Bowel: There is postsurgical changes of right hemicolectomy with ileotransverse anastomosis. There is no bowel obstruction or active inflammation. Appendectomy. Vascular/Lymphatic: Moderate aortoiliac atherosclerotic disease. The IVC is unremarkable. No portal venous gas. There is no adenopathy. Reproductive: The prostate and seminal vesicles are grossly unremarkable. No pelvic mass. Other: None Musculoskeletal: Bilateral L5 pars defects. Multilevel degenerative changes and disc desiccation and vacuum phenomena. No acute osseous pathology. IMPRESSION: 1. No acute intra-abdominal or pelvic pathology. No hydronephrosis or nephrolithiasis. 2. Postsurgical changes of right hemicolectomy with ileotransverse anastomosis. No bowel obstruction. 3. Aortic Atherosclerosis (ICD10-I70.0). Electronically Signed   By: Anner Crete M.D.   On: 10/20/2020 22:49    Procedures Procedures   Medications Ordered in ED Medications - No data to display  ED Course  I have reviewed the triage vital signs and the nursing notes.  Pertinent labs & imaging results that were available during my care of the patient were reviewed by me and considered in my medical decision making (see chart for details).    MDM Rules/Calculators/A&P                          Patient is a 65 year old male who presents with left-sided abdominal pain.  It comes and goes.  He does not have any significant current tenderness.  His labs show mild elevation in his lipase.  His other LFTs are normal.  He does not have significant tenderness over his pancreas.  CT scan shows no acute abnormality.  He had an episode of chest pain a week ago but none  since then.  He had some jaw discomfort which she says feels like muscle tension.  However he was concerned about a cardiac etiology.  He has had 2 negative troponins.  His EKG does not show any ischemic changes.  He does not have other symptoms that sound more concerning for ACS.  No exertional symptoms.  He was discharged home in good condition.  He was advised to follow-up with his PCP regarding his elevated lipase.  He does not have any evident gallstones.  He does not drink significant alcohol.  Return precautions were given. Final Clinical Impression(s) / ED Diagnoses Final diagnoses:  Left upper quadrant abdominal pain  Acute pancreatitis without infection or necrosis, unspecified pancreatitis type    Rx / DC Orders ED Discharge Orders     None        Malvin Johns, MD 10/20/20 2308

## 2020-10-28 ENCOUNTER — Other Ambulatory Visit: Payer: Self-pay

## 2020-10-28 ENCOUNTER — Inpatient Hospital Stay: Payer: 59 | Attending: Oncology | Admitting: Oncology

## 2020-10-28 ENCOUNTER — Other Ambulatory Visit: Payer: 59

## 2020-10-28 ENCOUNTER — Inpatient Hospital Stay: Payer: 59

## 2020-10-28 VITALS — BP 133/79 | HR 84 | Temp 97.8°F | Resp 20 | Ht 67.0 in | Wt 186.0 lb

## 2020-10-28 DIAGNOSIS — C182 Malignant neoplasm of ascending colon: Secondary | ICD-10-CM | POA: Diagnosis present

## 2020-10-28 DIAGNOSIS — C184 Malignant neoplasm of transverse colon: Secondary | ICD-10-CM

## 2020-10-28 LAB — CEA (ACCESS): CEA (CHCC): 3.17 ng/mL (ref 0.00–5.00)

## 2020-10-28 LAB — CEA (IN HOUSE-CHCC): CEA (CHCC-In House): 3.74 ng/mL (ref 0.00–5.00)

## 2020-10-28 NOTE — Progress Notes (Signed)
Jason Bray OFFICE PROGRESS NOTE   Diagnosis: Colon cancer  INTERVAL HISTORY:   Jason Bray returns as scheduled.  He feels well.  No difficulty with bowel function.  No bleeding.  Good appetite.  He is working. He underwent a colonoscopy on 06/20/2020.  There were internal hemorrhoids.  No specimen collected. He continues to have mild neuropathy symptoms in the feet.  He reports an abnormal sensation when he walks long course of sand.  No pain.  No numbness. Objective:  Vital signs in last 24 hours:  Blood pressure 133/79, pulse 84, temperature 97.8 F (36.6 C), temperature source Oral, resp. rate 20, height '5\' 7"'  (1.702 m), weight 186 lb (84.4 kg), SpO2 99 %.    Lymphatics: No cervical, supraclavicular, axillary, or inguinal nodes Resp: Lungs clear bilaterally Cardio: Regular rate and rhythm GI: No hepatosplenomegaly, no mass, nontender Vascular: No leg edema   Lab Results:  Lab Results  Component Value Date   WBC 8.4 10/20/2020   HGB 14.4 10/20/2020   HCT 43.1 10/20/2020   MCV 88.0 10/20/2020   PLT 377 10/20/2020   NEUTROABS 3.9 08/25/2015    CMP  Lab Results  Component Value Date   NA 139 10/20/2020   K 4.0 10/20/2020   CL 104 10/20/2020   CO2 25 10/20/2020   GLUCOSE 101 (H) 10/20/2020   BUN 16 10/20/2020   CREATININE 0.83 10/20/2020   CALCIUM 9.3 10/20/2020   PROT 7.7 10/20/2020   ALBUMIN 4.7 10/20/2020   AST 27 10/20/2020   ALT 38 10/20/2020   ALKPHOS 63 10/20/2020   BILITOT 0.8 10/20/2020   GFRNONAA >60 10/20/2020   GFRAA >90 09/18/2012    Lab Results  Component Value Date   CEA1 3.58 10/30/2019   CEA 1.7 08/25/2015     Medications: I have reviewed the patient's current medications.   Assessment/Plan:  Stage III (T3 N1) adenocarcinoma of the ascending colon status post partial colectomy 08/25/2012. The tumor is microsatellite stable and there was no loss of mismatch repair protein expression. He began cycle 1 of adjuvant  CAPOX on 09/29/2012.   He completed cycle 2 beginning 10/20/2012.   Cycle 3 beginning on 11/10/2012   Cycle 4 beginning 12/01/2012.   Cycle 5 12/22/2012   Cycle 6-Xeloda only, 01/12/2013   Cycle 7 02/02/2013   Cycle 8 02/23/2013 CTs of the chest, abdomen, and pelvis 08/20/2013-negative Negative surveillance colonoscopy 09/21/2013 CT 08/22/2014, new area of vague hyper enhancement in the posterior hepatic dome CTs 08/25/2015-no evidence of recurrent colon cancer, indeterminate hyperenhancing "masses" noted on arterial phase imaging in the right hepatic lobe--presented at the tumor board-liver lesions have been present with no change and were more prominent on the 08/25/2015 scan due to arterial phase imaging. Surveillance colonoscopy 12/31/2016-three 2-6 mm polyps in the rectum at the rectosigmoid colon (tubular adenomas, hyperplastic polyp) Surveillance colonoscopy 06/20/2020-internal hemorrhoids, otherwise negative Transverse colon polyp removed in the colectomy specimen 08/25/2012. Iron deficiency anemia. The hemoglobin and MCV corrected into normal range. History of Mild elevation of the liver enzymes-potentially related to atorvastatin, evaluation per Dr. Maceo Bray. Liver enzymes normal 02/24/2015. 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  He has a good prognosis for a long-term disease-free survival.  He will continue colonoscopy surveillance with Dr. Michail Bray.  He would like to continue clinical and CEA follow-up at the Cancer center.  He will return for an office visit in 1 year.  Jason Coder, MD  10/28/2020  9:14 AM

## 2020-10-29 ENCOUNTER — Telehealth: Payer: Self-pay

## 2020-10-29 NOTE — Telephone Encounter (Signed)
VM message left with normal CEA results. And to follow up as scheduled. Pt informed if any questions or concerns to return call to clinic.

## 2020-10-29 NOTE — Telephone Encounter (Signed)
-----   Message from Ladell Pier, MD sent at 10/28/2020  4:24 PM EDT ----- Please call patient, the CEA is normal, follow-up as scheduled

## 2021-09-02 DIAGNOSIS — E785 Hyperlipidemia, unspecified: Secondary | ICD-10-CM | POA: Diagnosis not present

## 2021-09-02 DIAGNOSIS — Z125 Encounter for screening for malignant neoplasm of prostate: Secondary | ICD-10-CM | POA: Diagnosis not present

## 2021-09-09 DIAGNOSIS — R82998 Other abnormal findings in urine: Secondary | ICD-10-CM | POA: Diagnosis not present

## 2021-09-09 DIAGNOSIS — C189 Malignant neoplasm of colon, unspecified: Secondary | ICD-10-CM | POA: Diagnosis not present

## 2021-09-09 DIAGNOSIS — Z0000 Encounter for general adult medical examination without abnormal findings: Secondary | ICD-10-CM | POA: Diagnosis not present

## 2021-09-09 DIAGNOSIS — Z1339 Encounter for screening examination for other mental health and behavioral disorders: Secondary | ICD-10-CM | POA: Diagnosis not present

## 2021-09-09 DIAGNOSIS — I10 Essential (primary) hypertension: Secondary | ICD-10-CM | POA: Diagnosis not present

## 2021-09-09 DIAGNOSIS — Z1331 Encounter for screening for depression: Secondary | ICD-10-CM | POA: Diagnosis not present

## 2021-10-27 ENCOUNTER — Inpatient Hospital Stay: Payer: BC Managed Care – PPO

## 2021-10-27 ENCOUNTER — Inpatient Hospital Stay: Payer: BC Managed Care – PPO | Attending: Oncology | Admitting: Oncology

## 2021-10-27 VITALS — BP 144/81 | HR 73 | Temp 98.1°F | Resp 20 | Ht 67.0 in | Wt 185.8 lb

## 2021-10-27 DIAGNOSIS — C184 Malignant neoplasm of transverse colon: Secondary | ICD-10-CM

## 2021-10-27 DIAGNOSIS — C182 Malignant neoplasm of ascending colon: Secondary | ICD-10-CM | POA: Insufficient documentation

## 2021-10-27 LAB — CEA (ACCESS): CEA (CHCC): 3.43 ng/mL (ref 0.00–5.00)

## 2021-10-27 NOTE — Progress Notes (Signed)
[facility] OFFICE PROGRESS NOTE   Diagnosis: Colon cancer  INTERVAL HISTORY:   Mr Harmening returns as scheduled.  He feels well.  Good appetite.  No difficulty with bowel function.  No bleeding.  He had an episode of abdominal pain in July 2022.  No abdominal pain at present.  He has intermittent subxiphoid discomfort.  He has mild remaining neuropathy symptoms in the feet.  He describes this as increased "sensitivity ".  Objective:  Vital signs in last 24 hours:  Blood pressure (!) 144/81, pulse 73, temperature 98.1 F (36.7 C), temperature source Oral, resp. rate 20, height $RemoveBe'5\' 7"'fBNcQABwg$  (1.702 m), weight 185 lb 12.8 oz (84.3 kg), SpO2 100 %.    Lymphatics: No cervical, supraclavicular, axillary, or inguinal nodes Resp: Lungs clear bilaterally Cardio: Regular rate and rhythm GI: No hepatosplenomegaly, nontender, no mass Vascular: No leg edema   Lab Results:  Lab Results  Component Value Date   WBC 8.4 10/20/2020   HGB 14.4 10/20/2020   HCT 43.1 10/20/2020   MCV 88.0 10/20/2020   PLT 377 10/20/2020   NEUTROABS 3.9 08/25/2015    CMP  Lab Results  Component Value Date   NA 139 10/20/2020   K 4.0 10/20/2020   CL 104 10/20/2020   CO2 25 10/20/2020   GLUCOSE 101 (H) 10/20/2020   BUN 16 10/20/2020   CREATININE 0.83 10/20/2020   CALCIUM 9.3 10/20/2020   PROT 7.7 10/20/2020   ALBUMIN 4.7 10/20/2020   AST 27 10/20/2020   ALT 38 10/20/2020   ALKPHOS 63 10/20/2020   BILITOT 0.8 10/20/2020   GFRNONAA >60 10/20/2020   GFRAA >90 09/18/2012    Lab Results  Component Value Date   CEA1 3.74 10/28/2020   CEA 3.43 10/27/2021    Medications: I have reviewed the patient's current medications.   Assessment/Plan: Stage III (T3 N1) adenocarcinoma of the ascending colon status post partial colectomy 08/25/2012. The tumor is microsatellite stable and there was no loss of mismatch repair protein expression. He began cycle 1 of adjuvant CAPOX on 09/29/2012.   He  completed cycle 2 beginning 10/20/2012.   Cycle 3 beginning on 11/10/2012   Cycle 4 beginning 12/01/2012.   Cycle 5 12/22/2012   Cycle 6-Xeloda only, 01/12/2013   Cycle 7 02/02/2013   Cycle 8 02/23/2013 CTs of the chest, abdomen, and pelvis 08/20/2013-negative Negative surveillance colonoscopy 09/21/2013 CT 08/22/2014, new area of vague hyper enhancement in the posterior hepatic dome CTs 08/25/2015-no evidence of recurrent colon cancer, indeterminate hyperenhancing "masses" noted on arterial phase imaging in the right hepatic lobe--presented at the tumor board-liver lesions have been present with no change and were more prominent on the 08/25/2015 scan due to arterial phase imaging. Surveillance colonoscopy 12/31/2016-three 2-6 mm polyps in the rectum at the rectosigmoid colon (tubular adenomas, hyperplastic polyp) Surveillance colonoscopy 06/20/2020-internal hemorrhoids, otherwise negative Transverse colon polyp removed in the colectomy specimen 08/25/2012. Iron deficiency anemia. The hemoglobin and MCV corrected into normal range. History of Mild elevation of the liver enzymes-potentially related to atorvastatin, evaluation per Dr. Maceo Pro. Liver enzymes normal 02/24/2015. 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  We will follow-up on the CEA from today.  He would like to continue follow-up at the Cancer center.  He will return for an office visit and CEA in 1 year.  He continues colonoscopy surveillance with Dr. Michail Sermon.  [name], MD  10/27/2021  9:37  AM    

## 2021-10-28 ENCOUNTER — Telehealth: Payer: Self-pay

## 2021-10-28 NOTE — Telephone Encounter (Signed)
-----   Message from Ladell Pier, MD sent at 10/27/2021  6:14 PM EDT ----- Please call patient, CEA is normal, follow-up as scheduled

## 2021-10-28 NOTE — Telephone Encounter (Signed)
V/M left with normal results. Pt informed to return call if any questions or concerns.

## 2022-03-15 DIAGNOSIS — Z23 Encounter for immunization: Secondary | ICD-10-CM | POA: Diagnosis not present

## 2022-03-15 DIAGNOSIS — C189 Malignant neoplasm of colon, unspecified: Secondary | ICD-10-CM | POA: Diagnosis not present

## 2022-03-15 DIAGNOSIS — R7989 Other specified abnormal findings of blood chemistry: Secondary | ICD-10-CM | POA: Diagnosis not present

## 2022-03-15 DIAGNOSIS — E785 Hyperlipidemia, unspecified: Secondary | ICD-10-CM | POA: Diagnosis not present

## 2022-06-16 DIAGNOSIS — E785 Hyperlipidemia, unspecified: Secondary | ICD-10-CM | POA: Diagnosis not present

## 2022-06-16 DIAGNOSIS — F419 Anxiety disorder, unspecified: Secondary | ICD-10-CM | POA: Diagnosis not present

## 2022-06-16 DIAGNOSIS — I10 Essential (primary) hypertension: Secondary | ICD-10-CM | POA: Diagnosis not present

## 2022-06-16 DIAGNOSIS — R0789 Other chest pain: Secondary | ICD-10-CM | POA: Diagnosis not present

## 2022-06-25 DIAGNOSIS — R0789 Other chest pain: Secondary | ICD-10-CM | POA: Diagnosis not present

## 2022-06-25 DIAGNOSIS — I10 Essential (primary) hypertension: Secondary | ICD-10-CM | POA: Diagnosis not present

## 2022-06-25 DIAGNOSIS — K219 Gastro-esophageal reflux disease without esophagitis: Secondary | ICD-10-CM | POA: Diagnosis not present

## 2022-06-25 DIAGNOSIS — E785 Hyperlipidemia, unspecified: Secondary | ICD-10-CM | POA: Diagnosis not present

## 2022-06-30 NOTE — Progress Notes (Signed)
Referring-Scott Holwerda Bray Reason for referral-chest pain  HPI: 67 year old male for evaluation of chest pain at request of Jason Bray.  Patient apparently had a cardiac catheterization in March 2011 secondary to chest pain that showed no epicardial coronary disease.  Patient has a history of epigastric discomfort.  It can last an hour at a time and does not radiate.  No associated symptoms.  Resolved spontaneously.  Note he denies exertional chest pain.  He has dyspnea with more vigorous activities but not routine activities.  No orthopnea, PND, pedal edema or syncope.  Cardiology now asked to evaluate.  Current Outpatient Medications  Medication Sig Dispense Refill   aspirin 81 MG tablet Take 81 mg by mouth daily. Taking sporadically     atorvastatin (LIPITOR) 20 MG tablet Take 20 mg by mouth daily.  5   famotidine (PEPCID) 20 MG tablet Take 20 mg by mouth 2 (two) times daily as needed.     LORazepam (ATIVAN) 0.5 MG tablet Take 0.25-0.5 mg by mouth daily as needed.     metoprolol succinate (TOPROL-XL) 50 MG 24 hr tablet Take 50 mg by mouth at bedtime. Take with or immediately following a meal.     Omega-3 Fatty Acids (FISH OIL PO) Take by mouth.     pantoprazole (PROTONIX) 40 MG tablet Take 40 mg by mouth 2 (two) times daily.     telmisartan (MICARDIS) 20 MG tablet Take 20 mg by mouth daily.     No current facility-administered medications for this visit.    No Known Allergies   Past Medical History:  Diagnosis Date   Anemia    Anxiety    colon ca dx'd 07/2012   cancerous colon polyp   GERD (gastroesophageal reflux disease)    Hyperlipidemia    Hypertension     Past Surgical History:  Procedure Laterality Date   CARDIAC CATHETERIZATION  06/2009   non cardiac   COLONOSCOPY     LAPAROSCOPIC PARTIAL COLECTOMY N/A 08/25/2012   Procedure: LAPAROSCOPIC PARTIAL COLECTOMY  ;  Surgeon: Adin Hector, Bray;  Location: [facility];  Service: General;  Laterality: N/A;    PORTACATH PLACEMENT N/A 09/22/2012   Procedure: INSERTION PORT-A-CATH UNDER ULTRASOUND & FLUROSCOPIC GUIDANCE;  Surgeon: Adin Hector, Bray;  Location: [facility];  Service: General;  Laterality: N/A;    Social History   Socioeconomic History   Marital status: Married    Spouse name: Benjamine Mola   Number of children: 3   Years of education: Not on file   Highest education level: Not on file  Occupational History   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   #  3 grown children    #1 grand child   Social Determinants of Radio broadcast assistant Strain: Not on file  Food Insecurity: Not on file  Transportation Needs: Not on file  Physical Activity: Not on file  Stress: Not on file  Social Connections: Not on file  Intimate Partner Violence: Not on file    Family History  Problem Relation Age of Onset   Lymphoma Mother    Coronary artery disease Mother    Hypertension Father    Hyperlipidemia Father    Hypertension Brother     ROS: no fevers or chills, productive cough, hemoptysis, dysphasia, odynophagia, melena, hematochezia, dysuria, hematuria, rash, seizure activity, orthopnea, PND, pedal edema, claudication. Remaining systems are negative.  Physical Exam:   Blood pressure 138/80, pulse 97, height 5\' 7"  (1.702 m), weight 183  lb (83 kg), SpO2 99 %.  General:  Well developed/well nourished in NAD Skin warm/dry Patient not depressed No peripheral clubbing Back-normal HEENT-normal/normal eyelids Neck supple/normal carotid upstroke bilaterally; no bruits; no JVD; no thyromegaly chest - CTA/ normal expansion CV - RRR/normal S1 and S2; no murmurs, rubs or gallops;  PMI nondisplaced Abdomen -NT/ND, no HSM, no mass, + bowel sounds, positive bruit 2+ femoral pulses, no bruits Ext-no edema, chords, 2+ DP Neuro-grossly nonfocal  ECG -normal sinus rhythm at a rate of 97, right axis deviation, nonspecific ST changes.  Personally reviewed  A/P  1 chest pain-patient complains of intermittent epigastric pain.  Symptoms are atypical but multiple risk factors.  Will arrange cardiac CTA to rule out obstructive coronary disease.  2 hyperlipidemia-continue statin.  Lipids and liver monitored by primary care.  3 Hypertension-blood pressure is borderline.  Will continue present medications.  Goal systolic blood pressure [id number] and diastolic 85.  He will follow at home.  4 tobacco abuse-patient counseled on discontinuing.  5 bruit-we will arrange abdominal ultrasound to exclude aneurysm in light of history of tobacco use.  Kirk Ruths, Bray

## 2022-07-06 ENCOUNTER — Ambulatory Visit: Payer: BC Managed Care – PPO | Attending: Cardiology | Admitting: Cardiology

## 2022-07-06 ENCOUNTER — Encounter: Payer: Self-pay | Admitting: Cardiology

## 2022-07-06 VITALS — BP 138/80 | HR 97 | Ht 67.0 in | Wt 183.0 lb

## 2022-07-06 DIAGNOSIS — E7800 Pure hypercholesterolemia, unspecified: Secondary | ICD-10-CM | POA: Diagnosis not present

## 2022-07-06 DIAGNOSIS — R0989 Other specified symptoms and signs involving the circulatory and respiratory systems: Secondary | ICD-10-CM | POA: Diagnosis not present

## 2022-07-06 DIAGNOSIS — I10 Essential (primary) hypertension: Secondary | ICD-10-CM

## 2022-07-06 DIAGNOSIS — R072 Precordial pain: Secondary | ICD-10-CM | POA: Diagnosis not present

## 2022-07-06 MED ORDER — METOPROLOL TARTRATE 100 MG PO TABS: ORAL_TABLET | ORAL | 0 refills | Status: DC

## 2022-07-06 NOTE — Patient Instructions (Addendum)
Medication Instructions:  Continue same medications    Lab Work: Bmet today   Testing/Procedures: Coronary Ct   will be scheduled after approved by insurance     Follow instructions below  Schedule Abdominal doppler    Follow-Up: At [facility], you and your health needs are our priority.  As part of our continuing mission to provide you with exceptional heart care, we have created designated Provider Care Teams.  These Care Teams include your primary Cardiologist (physician) and Advanced Practice Providers (APPs -  Physician Assistants and Nurse Practitioners) who all work together to provide you with the care you need, when you need it.  We recommend signing up for the patient portal called "MyChart".  Sign up information is provided on this After Visit Summary.  MyChart is used to connect with patients for Virtual Visits (Telemedicine).  Patients are able to view lab/test results, encounter notes, upcoming appointments, etc.  Non-urgent messages can be sent to your provider as well.   To learn more about what you can do with MyChart, go to [email or web address].    Your next appointment:  1 year     Call in Dec to schedule March appointment     Provider:  Dr.Crenshaw    Your cardiac CT will be scheduled at one of the below locations:   [facility] [address] [place], [place] [address] [phone]  [place] [address] [place], [place] [address] [phone]  [facility] [place],  [address] [phone]  If scheduled at [facility], please arrive at the [facility] Entrance (Entrance C2) of [facility] 30 minutes prior to test start time. You can use the FREE valet parking offered at entrance C (encouraged to control the heart rate for the test)  Proceed to the [facility] Radiology Department (first  floor) to check-in and test prep.  All radiology patients and guests should use entrance C2 at [facility], accessed from [facility], even though the hospital's physical address listed is [address].    If scheduled at [facility] or [facility], please arrive 15 mins early for check-in and test prep.   Please follow these instructions carefully (unless otherwise directed):  Hold all erectile dysfunction medications at least 3 days (72 hrs) prior to test. (Ie viagra, cialis, sildenafil, tadalafil, etc) We will administer nitroglycerin during this exam.   On the Night Before the Test: Be sure to Drink plenty of water. Do not consume any caffeinated/decaffeinated beverages or chocolate 12 hours prior to your test. Do not take any antihistamines 12 hours prior to your test.   On the Day of the Test: Drink plenty of water until 1 hour prior to the test. Do not eat any food 1 hour prior to test. You may take your regular medications prior to the test.  Take metoprolol 100 mg two hours prior to test.          After the Test: Drink plenty of water. After receiving IV contrast, you may experience a mild flushed feeling. This is normal. On occasion, you may experience a mild rash up to 24 hours after the test. This is not dangerous. If this occurs, you can take Benadryl 25 mg and increase your fluid intake. If you experience trouble breathing, this can be serious. If it is severe call  [phone] IMMEDIATELY. If it is mild, please call our office.  We will call to schedule your test 2-4 weeks out understanding that some insurance companies will need an authorization prior to the service being performed.   For non-scheduling related questions, please contact the cardiac imaging nurse navigator should you have any questions/concerns: Marchia Bond, Cardiac Imaging Nurse Navigator Gordy Clement, Cardiac Imaging Nurse  Navigator [facility] Direct Office Dial: [phone]   For scheduling needs, including cancellations and rescheduling, please call [place], [phone].

## 2022-07-07 LAB — BASIC METABOLIC PANEL
BUN/Creatinine Ratio: 16 (ref 10–24)
BUN: 15 mg/dL (ref 8–27)
CO2: 23 mmol/L (ref 20–29)
Calcium: 10.5 mg/dL — ABNORMAL HIGH (ref 8.6–10.2)
Chloride: 101 mmol/L (ref 96–106)
Creatinine, Ser: 0.91 mg/dL (ref 0.76–1.27)
Glucose: 92 mg/dL (ref 70–99)
Potassium: 5.2 mmol/L (ref 3.5–5.2)
Sodium: 144 mmol/L (ref 134–144)
eGFR: 92 mL/min/{1.73_m2} (ref >59)

## 2022-07-12 ENCOUNTER — Telehealth (HOSPITAL_COMMUNITY): Payer: Self-pay | Admitting: *Deleted

## 2022-07-12 ENCOUNTER — Telehealth (HOSPITAL_COMMUNITY): Payer: Self-pay | Admitting: Emergency Medicine

## 2022-07-12 NOTE — Telephone Encounter (Signed)
Patient returning call about his upcoming cardiac imaging study; pt verbalizes understanding of appt date/time, parking situation and where to check in, pre-test NPO status and medications ordered, and verified current allergies; name and call back number provided for further questions should they arise  Gordy Clement RN Navigator Cardiac Imaging [facility] [phone] office [phone] cell  Patient to take 100mg  metoprolol tartrate two hours prior to his cardiac CT scan.  He is aware to arrive at 3:30pm.

## 2022-07-12 NOTE — Telephone Encounter (Signed)
Attempted to call patient regarding upcoming cardiac CT appointment. °Left message on voicemail with name and callback number °Marsia Cino RN Navigator Cardiac Imaging °[facility] °[phone] Office °[phone] Cell ° °

## 2022-07-13 ENCOUNTER — Ambulatory Visit (HOSPITAL_COMMUNITY)
Admission: RE | Admit: 2022-07-13 | Discharge: 2022-07-13 | Disposition: A | Discharge status: Home / Self Care | Payer: BC Managed Care – PPO | Source: Ambulatory Visit | Attending: Cardiology | Admitting: Cardiology

## 2022-07-13 DIAGNOSIS — R0989 Other specified symptoms and signs involving the circulatory and respiratory systems: Secondary | ICD-10-CM | POA: Diagnosis not present

## 2022-07-13 DIAGNOSIS — I10 Essential (primary) hypertension: Secondary | ICD-10-CM | POA: Diagnosis not present

## 2022-07-13 DIAGNOSIS — E7800 Pure hypercholesterolemia, unspecified: Secondary | ICD-10-CM | POA: Diagnosis not present

## 2022-07-13 DIAGNOSIS — R072 Precordial pain: Secondary | ICD-10-CM | POA: Insufficient documentation

## 2022-07-13 MED ORDER — NITROGLYCERIN 0.4 MG SL SUBL
SUBLINGUAL_TABLET | SUBLINGUAL | Status: AC
  Filled 2022-07-13: qty 2

## 2022-07-13 MED ORDER — IOHEXOL 350 MG/ML SOLN
95.0000 mL | Freq: Once | INTRAVENOUS | Status: AC | PRN
  Administered 2022-07-13: 95 mL via INTRAVENOUS

## 2022-07-13 MED ORDER — NITROGLYCERIN 0.4 MG SL SUBL
0.8000 mg | SUBLINGUAL_TABLET | Freq: Once | SUBLINGUAL | Status: AC
  Administered 2022-07-13: 0.8 mg via SUBLINGUAL

## 2022-07-15 ENCOUNTER — Ambulatory Visit (HOSPITAL_COMMUNITY)
Admission: RE | Admit: 2022-07-15 | Discharge: 2022-07-15 | Disposition: A | Discharge status: Home / Self Care | Payer: BC Managed Care – PPO | Source: Ambulatory Visit | Attending: Cardiology | Admitting: Cardiology

## 2022-07-15 DIAGNOSIS — E7800 Pure hypercholesterolemia, unspecified: Secondary | ICD-10-CM | POA: Diagnosis not present

## 2022-07-15 DIAGNOSIS — R072 Precordial pain: Secondary | ICD-10-CM | POA: Diagnosis not present

## 2022-07-15 DIAGNOSIS — I10 Essential (primary) hypertension: Secondary | ICD-10-CM | POA: Diagnosis not present

## 2022-07-15 DIAGNOSIS — R0989 Other specified symptoms and signs involving the circulatory and respiratory systems: Secondary | ICD-10-CM | POA: Diagnosis not present

## 2022-08-05 ENCOUNTER — Encounter: Payer: Self-pay | Admitting: *Deleted

## 2022-09-14 DIAGNOSIS — E785 Hyperlipidemia, unspecified: Secondary | ICD-10-CM | POA: Diagnosis not present

## 2022-09-14 DIAGNOSIS — K219 Gastro-esophageal reflux disease without esophagitis: Secondary | ICD-10-CM | POA: Diagnosis not present

## 2022-09-14 DIAGNOSIS — R7989 Other specified abnormal findings of blood chemistry: Secondary | ICD-10-CM | POA: Diagnosis not present

## 2022-09-14 DIAGNOSIS — I10 Essential (primary) hypertension: Secondary | ICD-10-CM | POA: Diagnosis not present

## 2022-09-14 DIAGNOSIS — Z125 Encounter for screening for malignant neoplasm of prostate: Secondary | ICD-10-CM | POA: Diagnosis not present

## 2022-09-21 DIAGNOSIS — K219 Gastro-esophageal reflux disease without esophagitis: Secondary | ICD-10-CM | POA: Diagnosis not present

## 2022-09-21 DIAGNOSIS — Z1339 Encounter for screening examination for other mental health and behavioral disorders: Secondary | ICD-10-CM | POA: Diagnosis not present

## 2022-09-21 DIAGNOSIS — Z0000 Encounter for general adult medical examination without abnormal findings: Secondary | ICD-10-CM | POA: Diagnosis not present

## 2022-09-21 DIAGNOSIS — R82998 Other abnormal findings in urine: Secondary | ICD-10-CM | POA: Diagnosis not present

## 2022-09-21 DIAGNOSIS — Z1331 Encounter for screening for depression: Secondary | ICD-10-CM | POA: Diagnosis not present

## 2022-10-28 ENCOUNTER — Inpatient Hospital Stay: Payer: BC Managed Care – PPO | Admitting: Oncology

## 2022-10-28 ENCOUNTER — Inpatient Hospital Stay: Payer: BC Managed Care – PPO | Attending: Oncology

## 2022-10-28 VITALS — BP 143/73 | HR 76 | Temp 98.1°F | Resp 18 | Ht 67.0 in | Wt 188.0 lb

## 2022-10-28 DIAGNOSIS — D509 Iron deficiency anemia, unspecified: Secondary | ICD-10-CM | POA: Diagnosis not present

## 2022-10-28 DIAGNOSIS — C184 Malignant neoplasm of transverse colon: Secondary | ICD-10-CM | POA: Diagnosis not present

## 2022-10-28 DIAGNOSIS — C182 Malignant neoplasm of ascending colon: Secondary | ICD-10-CM | POA: Insufficient documentation

## 2022-10-28 DIAGNOSIS — G629 Polyneuropathy, unspecified: Secondary | ICD-10-CM | POA: Diagnosis not present

## 2022-10-28 LAB — CEA (ACCESS): CEA (CHCC): 2.62 ng/mL (ref 0.00–5.00)

## 2022-10-28 NOTE — Progress Notes (Signed)
[facility] OFFICE PROGRESS NOTE   Diagnosis: Colon cancer  INTERVAL HISTORY:   Jason Bray returns as scheduled.  He feels well.  Good appetite.  No difficulty with bowel function.  No bleeding.  He has intermittent anterior chest discomfort.  He takes an acids with improvement.  He was referred to cardiology and underwent a cardiac CT 07/13/2022 which revealed mild nonobstructive CAD. He continues to have mild neuropathy symptoms in the feet.  He feels as though something is touching the plantar surface of the distal foot/proximal toes Objective:  Vital signs in last 24 hours:  Blood pressure (!) 143/73, pulse 76, temperature 98.1 F (36.7 C), temperature source Oral, resp. rate 18, height 5\' 7"  (1.702 m), weight 188 lb (85.3 kg), SpO2 100%.    Lymphatics: No cervical, supra vascular, axillary, or inguinal nodes Resp: Lungs clear bilaterally Cardio: Regular rate and rhythm GI: Nontender, no mass, no hepatosplenomegaly Vascular: No leg edema   Lab Results:  Lab Results  Component Value Date   WBC 8.4 10/20/2020   HGB 14.4 10/20/2020   HCT 43.1 10/20/2020   MCV 88.0 10/20/2020   PLT 377 10/20/2020   NEUTROABS 3.9 08/25/2015    CMP  Lab Results  Component Value Date   NA 144 07/06/2022   K 5.2 07/06/2022   CL 101 07/06/2022   CO2 23 07/06/2022   GLUCOSE 92 07/06/2022   BUN 15 07/06/2022   CREATININE 0.91 07/06/2022   CALCIUM 10.5 (H) 07/06/2022   PROT 7.7 10/20/2020   ALBUMIN 4.7 10/20/2020   AST 27 10/20/2020   ALT 38 10/20/2020   ALKPHOS 63 10/20/2020   BILITOT 0.8 10/20/2020   GFRNONAA >60 10/20/2020   GFRAA >90 09/18/2012    Lab Results  Component Value Date   CEA1 3.74 10/28/2020   CEA 3.43 10/27/2021      Medications: I have reviewed the patient's current medications.   Assessment/Plan: Stage III (T3 N1) adenocarcinoma of the ascending colon status post partial colectomy 08/25/2012. The tumor is microsatellite stable and there was  no loss of mismatch repair protein expression. He began cycle 1 of adjuvant CAPOX on 09/29/2012.   He completed cycle 2 beginning 10/20/2012.   Cycle 3 beginning on 11/10/2012   Cycle 4 beginning 12/01/2012.   Cycle 5 12/22/2012   Cycle 6-Xeloda only, 01/12/2013   Cycle 7 02/02/2013   Cycle 8 02/23/2013 CTs of the chest, abdomen, and pelvis 08/20/2013-negative Negative surveillance colonoscopy 09/21/2013 CT 08/22/2014, new area of vague hyper enhancement in the posterior hepatic dome CTs 08/25/2015-no evidence of recurrent colon cancer, indeterminate hyperenhancing "masses" noted on arterial phase imaging in the right hepatic lobe--presented at the tumor board-liver lesions have been present with no change and were more prominent on the 08/25/2015 scan due to arterial phase imaging. Surveillance colonoscopy 12/31/2016-three 2-6 mm polyps in the rectum at the rectosigmoid colon (tubular adenomas, hyperplastic polyp) Surveillance colonoscopy 06/20/2020-internal hemorrhoids, otherwise negative Transverse colon polyp removed in the colectomy specimen 08/25/2012. Iron deficiency anemia. The hemoglobin and MCV corrected into normal range. History of Mild elevation of the liver enzymes-potentially related to atorvastatin, evaluation per Dr. Foy [place]. Liver enzymes normal 02/24/2015. 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  He like to continue office and CEA follow-up at the Cancer center.  He will return for an office and lab visit in 1 year.  [name], MD  10/28/2022  9:08 AM

## 2023-09-13 NOTE — Progress Notes (Signed)
 HPI: FU CAD. Patient apparently had a cardiac catheterization in March 2011 secondary to chest pain that showed no epicardial coronary disease.  Coronary CTA April 2024 showed calcium score 178 which was 60th percentile, mild stenosis in the right coronary artery and LAD.  Abdominal ultrasound April 2024 showed no aneurysm.  Since last seen patient denies dyspnea, exertional chest pain, palpitations or syncope.  Current Outpatient Medications  Medication Sig Dispense Refill   aspirin 81 MG tablet Take 81 mg by mouth daily.     famotidine (PEPCID) 20 MG tablet Take 20 mg by mouth 2 (two) times daily as needed.     LORazepam (ATIVAN) 0.5 MG tablet Take 0.25-0.5 mg by mouth daily as needed.     metoprolol  succinate (TOPROL -XL) 50 MG 24 hr tablet Take 50 mg by mouth at bedtime. Take with or immediately following a meal.     omega-3 acid ethyl esters (LOVAZA) 1 g capsule Take 2 g by mouth daily.     Omega-3 Fatty Acids (FISH OIL PO) Take by mouth.     pantoprazole (PROTONIX) 40 MG tablet Take 40 mg by mouth 2 (two) times daily.     rosuvastatin (CRESTOR) 40 MG tablet Take 40 mg by mouth.     telmisartan (MICARDIS) 20 MG tablet Take 20 mg by mouth daily.     No current facility-administered medications for this visit.     Past Medical History:  Diagnosis Date   Anemia    Anxiety    colon ca dx'd 07/2012   cancerous colon polyp   GERD (gastroesophageal reflux disease)    Hyperlipidemia    Hypertension     Past Surgical History:  Procedure Laterality Date   CARDIAC CATHETERIZATION  06/2009   non cardiac   COLONOSCOPY     LAPAROSCOPIC PARTIAL COLECTOMY N/A 08/25/2012   Procedure: LAPAROSCOPIC PARTIAL COLECTOMY  ;  Surgeon: Eddye Goodie, MD;  Location: [facility];  Service: General;  Laterality: N/A;   PORTACATH PLACEMENT N/A 09/22/2012   Procedure: INSERTION PORT-A-CATH UNDER ULTRASOUND & FLUROSCOPIC GUIDANCE;  Surgeon: Eddye Goodie, MD;  Location: [facility];  Service: General;   Laterality: N/A;    Social History   Socioeconomic History   Marital status: Married    Spouse name: Nellie Banas   Number of children: 3   Years of education: Not on file   Highest education level: Not on file  Occupational History   Occupation: Teaching laboratory technician: [employer]  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   #3 grown children    #1 grand child   Social Drivers of Corporate investment banker Strain: Not on file  Food Insecurity: Not on file  Transportation Needs: Not on file  Physical Activity: Not on file  Stress: Not on file  Social Connections: Not on file  Intimate Partner Violence: Not on file    Family History  Problem Relation Age of Onset   Lymphoma Mother    Coronary artery disease Mother    Hypertension Father    Hyperlipidemia Father    Hypertension Brother     ROS: no fevers or chills, productive cough, hemoptysis, dysphasia, odynophagia, melena, hematochezia, dysuria, hematuria, rash, seizure activity, orthopnea, PND, pedal edema, claudication. Remaining systems are negative.  Physical Exam: Well-developed well-nourished in no acute distress.  Skin is warm and dry.  HEENT is normal.  Neck is supple.  Chest is clear to auscultation with normal expansion.  Cardiovascular exam is regular rate and rhythm.  Abdominal  exam nontender or distended. No masses palpated. Extremities show no edema. neuro grossly intact  EKG Interpretation Date/Time:  Tuesday September 27 2023 08:01:07 EDT Ventricular Rate:  77 PR Interval:  188 QRS Duration:  112 QT Interval:  394 QTC Calculation: 445 R Axis:   95  Text Interpretation: Normal sinus rhythm Rightward axis Incomplete right bundle branch block Confirmed by Alexandria Angel ([id number]) on 09/27/2023 8:02:03 AM     A/P  1 CAD-mild on prior CTA. Continue ASA and statin.  Patient denies chest pain.  2 hypertension-patient's blood pressure is mildly elevated; he will track this at home and we will advance medications as needed.  Goal systolic blood pressure less than 130 and diastolic less than 85.  3 hyperlipidemia-continue statin.  4 tobacco abuse-he has decreased his use.  We discussed the importance of avoiding.  Alexand[place] Angel, MD

## 2023-09-27 ENCOUNTER — Encounter: Payer: Self-pay | Admitting: Cardiology

## 2023-09-27 ENCOUNTER — Ambulatory Visit: Attending: Cardiology | Admitting: Cardiology

## 2023-09-27 VITALS — BP 138/80 | HR 77 | Ht 67.0 in | Wt 186.0 lb

## 2023-09-27 DIAGNOSIS — I10 Essential (primary) hypertension: Secondary | ICD-10-CM

## 2023-09-27 DIAGNOSIS — Z720 Tobacco use: Secondary | ICD-10-CM | POA: Diagnosis not present

## 2023-09-27 DIAGNOSIS — E7800 Pure hypercholesterolemia, unspecified: Secondary | ICD-10-CM | POA: Diagnosis not present

## 2023-09-27 DIAGNOSIS — I2510 Atherosclerotic heart disease of native coronary artery without angina pectoris: Secondary | ICD-10-CM

## 2023-09-27 LAB — LAB REPORT - SCANNED
EGFR: 101.5
EGFR: 83.9

## 2023-09-27 NOTE — Patient Instructions (Signed)

## 2023-10-28 ENCOUNTER — Other Ambulatory Visit: Payer: Self-pay

## 2023-10-28 ENCOUNTER — Other Ambulatory Visit: Payer: BC Managed Care – PPO

## 2023-10-28 ENCOUNTER — Inpatient Hospital Stay: Payer: BC Managed Care – PPO | Attending: Oncology | Admitting: Oncology

## 2023-10-28 ENCOUNTER — Ambulatory Visit: Payer: Self-pay | Admitting: Oncology

## 2023-10-28 VITALS — BP 133/81 | HR 73 | Temp 97.9°F | Resp 18 | Ht 67.0 in | Wt 187.2 lb

## 2023-10-28 DIAGNOSIS — C184 Malignant neoplasm of transverse colon: Secondary | ICD-10-CM

## 2023-10-28 DIAGNOSIS — Z85038 Personal history of other malignant neoplasm of large intestine: Secondary | ICD-10-CM | POA: Insufficient documentation

## 2023-10-28 DIAGNOSIS — K219 Gastro-esophageal reflux disease without esophagitis: Secondary | ICD-10-CM | POA: Diagnosis not present

## 2023-10-28 LAB — CEA (ACCESS): CEA (CHCC): 2.82 ng/mL (ref 0.00–5.00)

## 2023-10-28 NOTE — Progress Notes (Signed)
 [facility] OFFICE PROGRESS NOTE   Diagnosis: Colon cancer  INTERVAL HISTORY:   Jason Bray returns as scheduled.  Good appetite.  No difficulty with bowel function.  No bleeding.  He has intermittent reflux symptoms.  This occurs chiefly with certain foods and alcohol .  He continues pantoprazole and famotidine.  He has occasional fullness in the subxiphoid region after eating.  He sometimes has anxiety when he has reflux symptoms.  Objective:  Vital signs in last 24 hours:  Blood pressure 133/81, pulse 73, temperature 97.9 F (36.6 C), temperature source Temporal, resp. rate 18, height 5' 7 (1.702 m), weight 187 lb 3.2 oz (84.9 kg), SpO2 100%.   Lymphatics: No cervical, supraclavicular, axillary, or inguinal nodes Resp: Lungs clear bilaterally Cardio: Regular rate and rhythm GI: No hepatosplenomegaly, nontender, no mass Vascular: No leg edema   Lab Results:  Lab Results  Component Value Date   WBC 8.4 10/20/2020   HGB 14.4 10/20/2020   HCT 43.1 10/20/2020   MCV 88.0 10/20/2020   PLT 377 10/20/2020   NEUTROABS 3.9 08/25/2015    CMP  Lab Results  Component Value Date   NA 144 07/06/2022   K 5.2 07/06/2022   CL 101 07/06/2022   CO2 23 07/06/2022   GLUCOSE 92 07/06/2022   BUN 15 07/06/2022   CREATININE 0.91 07/06/2022   CALCIUM 10.5 (H) 07/06/2022   PROT 7.7 10/20/2020   ALBUMIN 4.7 10/20/2020   AST 27 10/20/2020   ALT 38 10/20/2020   ALKPHOS 63 10/20/2020   BILITOT 0.8 10/20/2020   GFRNONAA >60 10/20/2020   GFRAA >90 09/18/2012    Lab Results  Component Value Date   CEA1 3.74 10/28/2020   CEA 2.62 10/28/2022     Medications: I have reviewed the patient's current medications.   Assessment/Plan: Stage III (T3 N1) adenocarcinoma of the ascending colon status post partial colectomy 08/25/2012. The tumor is microsatellite stable and there was no loss of mismatch repair protein expression. He began cycle 1 of adjuvant CAPOX on 09/29/2012.    He completed cycle 2 beginning 10/20/2012.   Cycle 3 beginning on 11/10/2012   Cycle 4 beginning 12/01/2012.   Cycle 5 12/22/2012   Cycle 6-Xeloda  only, 01/12/2013   Cycle 7 02/02/2013   Cycle 8 02/23/2013 CTs of the chest, abdomen, and pelvis 08/20/2013-negative Negative surveillance colonoscopy 09/21/2013 CT 08/22/2014, new area of vague hyper enhancement in the posterior hepatic dome CTs 08/25/2015-no evidence of recurrent colon cancer, indeterminate hyperenhancing masses noted on arterial phase imaging in the right hepatic lobe--presented at the tumor board-liver lesions have been present with no change and were more prominent on the 08/25/2015 scan due to arterial phase imaging. Surveillance colonoscopy 12/31/2016-three 2-6 mm polyps in the rectum at the rectosigmoid colon (tubular adenomas, hyperplastic polyp) Surveillance colonoscopy 06/20/2020-internal hemorrhoids, otherwise negative Transverse colon polyp removed in the colectomy specimen 08/25/2012. Iron deficiency anemia. The hemoglobin and MCV corrected into normal range. History of Mild elevation of the liver enzymes-potentially related to atorvastatin, evaluation per Dr. Brien. Liver enzymes normal 02/24/2015. Oxaliplatin  persistent mild neuropathy symptoms in the feet    Disposition: Mr Kissling is in clinical remission from colon cancer.  We will follow-up on the CEA from today.  He would like to continue follow-up in the medical oncology clinic.  He will return for an office visit in 1 year. I recommended he follow-up with Dr. Dianna for evaluation of the persistent reflux symptoms.  He will be due for a colonoscopy in  [id number] Arley Hof, MD  10/28/2023  8:23 AM

## 2024-01-09 ENCOUNTER — Telehealth: Payer: Self-pay

## 2024-01-09 NOTE — Telephone Encounter (Signed)
 The patient's schedule has been updated to reflect the provider's new office hours. A reminder letter has been sent with the revised appointment time.

## 2024-10-26 ENCOUNTER — Other Ambulatory Visit

## 2024-10-26 ENCOUNTER — Ambulatory Visit: Admitting: Oncology

## 2024-11-01 ENCOUNTER — Other Ambulatory Visit

## 2024-11-01 ENCOUNTER — Ambulatory Visit: Admitting: Oncology
